# Patient Record
Sex: Male | Born: 1995 | Race: Black or African American | Hispanic: No | Marital: Single | Smoking: Never smoker
Health system: Southern US, Community
[De-identification: ages and names within clinical notes are randomized; demographics above are authoritative.]

## PROBLEM LIST (undated history)

## (undated) DIAGNOSIS — F4310 Post-traumatic stress disorder, unspecified: Secondary | ICD-10-CM

## (undated) HISTORY — DX: Post-traumatic stress disorder, unspecified: F43.10

---
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
## 2015-04-01 ENCOUNTER — Other Ambulatory Visit: Payer: Self-pay | Admitting: Orthopedic Surgery

## 2015-04-01 DIAGNOSIS — M25561 Pain in right knee: Secondary | ICD-10-CM

## 2015-04-06 ENCOUNTER — Ambulatory Visit
Admission: RE | Admit: 2015-04-06 | Discharge: 2015-04-06 | Disposition: A | Discharge status: Home / Self Care | Source: Ambulatory Visit | Attending: Orthopedic Surgery | Admitting: Orthopedic Surgery

## 2015-04-06 DIAGNOSIS — M25561 Pain in right knee: Secondary | ICD-10-CM

## 2015-04-21 ENCOUNTER — Ambulatory Visit (INDEPENDENT_AMBULATORY_CARE_PROVIDER_SITE_OTHER): Admitting: Physician Assistant

## 2015-04-21 VITALS — BP 120/72 | HR 80 | Temp 98.2°F | Resp 18 | Ht 72.0 in | Wt 190.0 lb

## 2015-04-21 DIAGNOSIS — Z113 Encounter for screening for infections with a predominantly sexual mode of transmission: Secondary | ICD-10-CM | POA: Diagnosis not present

## 2015-04-21 DIAGNOSIS — L293 Anogenital pruritus, unspecified: Secondary | ICD-10-CM

## 2015-04-21 LAB — POCT URINALYSIS DIP (MANUAL ENTRY)
BILIRUBIN UA: NEGATIVE
BILIRUBIN UA: NEGATIVE
Blood, UA: NEGATIVE
Glucose, UA: NEGATIVE
LEUKOCYTES UA: NEGATIVE
Nitrite, UA: NEGATIVE
SPEC GRAV UA: 1.025
Urobilinogen, UA: 0.2
pH, UA: 7

## 2015-04-21 NOTE — Patient Instructions (Signed)
I will call you with results of your lab tests. Wear condoms for every sexual encounter to protect yourself from STDs and prevent any unwanted pregnancy. Get STD testing BEFORE starting a new sexual relationship and make sure your partners get tested as well. Return with further problems/concerns.

## 2015-04-21 NOTE — Progress Notes (Signed)
[facility] [address] [place] [address] [phone]  Date:  04/21/2015   Name:  Evan Turner   DOB:  26-Apr-1996   MRN:  [id number]  PCP:  No primary care provider on file.    Chief Complaint: std check   History of Present Illness:  This is a 19 y.o. male who is presenting wanting STD testing. Had STD testing this summer and negative. Having a mild itch at the tip of his penis x 4 days. Stayed the same since then. Denies dysuria or penile discharge. States there is no rash or skin changes. Sexually active with women.  2 sexual partners in the past 6 months. Most recently sexually active with new partner 1 week ago and they did not use condoms. He has never had an STD before.  Review of Systems:  Review of Systems See HPI  There are no active problems to display for this patient.   Prior to Admission medications   Medication Sig Start Date End Date Taking? Authorizing Provider  aluminum chloride (DRYSOL) 20 % external solution Apply topically daily. 08/12/14  Yes Elvina Sidle, MD    No Known Allergies  History reviewed. No pertinent past surgical history.  Social History  Substance Use Topics  . Smoking status: Never Smoker   . Smokeless tobacco: None  . Alcohol Use: No    Family History  Problem Relation Age of Onset  . Hypertension Mother     Medication list has been reviewed and updated.  Physical Examination:  Physical Exam  Constitutional: He is oriented to person, place, and time. He appears well-developed and well-nourished. No distress.  HENT:  Head: Normocephalic and atraumatic.  Right Ear: Hearing normal.  Left Ear: Hearing normal.  Nose: Nose normal.  Eyes: Conjunctivae and lids are normal. Right eye exhibits no discharge. Left eye exhibits no discharge. No scleral icterus.  Pulmonary/Chest: Effort normal. No respiratory distress.  Genitourinary:  Pt declined exam  Musculoskeletal: Normal range of motion.   Neurological: He is alert and oriented to person, place, and time.  Skin: Skin is warm, dry and intact. No lesion and no rash noted.  Psychiatric: He has a normal mood and affect. His speech is normal and behavior is normal. Thought content normal.   BP 120/72 mmHg  Pulse 80  Temp(Src) 98.2 F (36.8 C) (Oral)  Resp 18  Ht 6' (1.829 m)  Wt 190 lb (86.183 kg)  BMI 25.76 kg/m2  SpO2 98%  Results for orders placed or performed in visit on 04/21/15  POCT urinalysis dipstick  Result Value Ref Range   Color, UA yellow yellow   Clarity, UA clear clear   Glucose, UA negative negative   Bilirubin, UA negative negative   Ketones, POC UA negative negative   Spec Grav, UA 1.025    Blood, UA negative negative   pH, UA 7.0    Protein Ur, POC trace (A) negative   Urobilinogen, UA 0.2    Nitrite, UA Negative Negative   Leukocytes, UA Negative Negative   Assessment and Plan:  1. Screen for STD (sexually transmitted disease) 2. Itching of penis UA normal. STD panel pending. If STDs negative and symptoms persist he will need to return for further evaluation and examination. We discussed safe sex practices and regular STD testing at length. - GC/Chlamydia Probe Amp - Hepatitis C antibody - Hepatitis B surface antigen - HIV antibody - RPR - POCT urinalysis dipstick   Roswell Miners. Danae Orleans,  PA-C, MHS [facility]  04/21/2015

## 2015-04-22 ENCOUNTER — Telehealth: Payer: Self-pay | Admitting: Physician Assistant

## 2015-04-22 DIAGNOSIS — A749 Chlamydial infection, unspecified: Secondary | ICD-10-CM

## 2015-04-22 LAB — HEPATITIS C ANTIBODY: HCV Ab: NEGATIVE

## 2015-04-22 LAB — HEPATITIS B SURFACE ANTIGEN: Hepatitis B Surface Ag: NEGATIVE

## 2015-04-22 LAB — HIV ANTIBODY (ROUTINE TESTING W REFLEX): HIV 1&2 Ab, 4th Generation: NONREACTIVE

## 2015-04-22 LAB — GC/CHLAMYDIA PROBE AMP
CT Probe RNA: POSITIVE — AB
GC Probe RNA: NEGATIVE

## 2015-04-22 LAB — RPR

## 2015-04-22 MED ORDER — AZITHROMYCIN 500 MG PO TABS: ORAL_TABLET | ORAL | Status: DC

## 2015-04-22 NOTE — Telephone Encounter (Signed)
Notified patient that he has chlamydia.  zithromax sent to pharmacy. Counseled on abstaining for sex for next 10 days and safe sex practices thereafter. He will notify partners. Please sent card to health dept.

## 2015-04-22 NOTE — Telephone Encounter (Signed)
Noted. [facility]

## 2015-07-28 ENCOUNTER — Ambulatory Visit (INDEPENDENT_AMBULATORY_CARE_PROVIDER_SITE_OTHER): Admitting: Family Medicine

## 2015-07-28 VITALS — BP 108/70 | HR 79 | Temp 98.3°F | Resp 16 | Ht 72.0 in | Wt 190.0 lb

## 2015-07-28 DIAGNOSIS — J069 Acute upper respiratory infection, unspecified: Secondary | ICD-10-CM

## 2015-07-28 NOTE — Assessment & Plan Note (Signed)
Viral URI.  No evidence of bacterial involvement or influenza.   - Sx management with Mucinex, Nasal Saline, cough medication, Nasonex if needed, ibuprofen.   - F/U PRN

## 2015-07-28 NOTE — Progress Notes (Signed)
Evan Turner is a 20 y.o. male who presents today for possible flu.  URI - Ongoing now since Sunday night (4 days), improving at this time.  Does endorse cough and sensation of having fever.  Denies occasional body aches but no chills.  Did have a few episodes of sweats but these have dissipated.  Nyquil helped quite a bit.  No recent sick contacts/travel.   History reviewed. No pertinent past medical history.  History  Smoking status  . Never Smoker   Smokeless tobacco  . Not on file    Family History  Problem Relation Age of Onset  . Hypertension Mother     Current Outpatient Prescriptions on File Prior to Visit  Medication Sig Dispense Refill  . aluminum chloride (DRYSOL) 20 % external solution Apply topically daily. (Patient not taking: Reported on 07/28/2015) 60 mL 11  . azithromycin (ZITHROMAX) 500 MG tablet Take 2 tabs (1 gm) po once. (Patient not taking: Reported on 07/28/2015) 2 tablet 0   No current facility-administered medications on file prior to visit.    ROS: Per HPI.  All other systems reviewed and are negative.   Physical Exam Filed Vitals:   07/28/15 1826  BP: 108/70  Pulse: 79  Temp: 98.3 F (36.8 C)  Resp: 16    Physical Examination: General appearance - alert, well appearing, and in no distress Mouth - mucous membranes moist, pharynx normal without lesions Lymphatics - no palpable lymphadenopathy Chest - clear to auscultation, no wheezes, rales or rhonchi, symmetric air entry Heart - normal rate and regular rhythm

## 2015-08-02 ENCOUNTER — Ambulatory Visit (INDEPENDENT_AMBULATORY_CARE_PROVIDER_SITE_OTHER): Admitting: Family Medicine

## 2015-08-02 VITALS — BP 102/68 | HR 70 | Temp 98.3°F | Resp 16 | Ht 73.0 in | Wt 190.0 lb

## 2015-08-02 DIAGNOSIS — Z1383 Encounter for screening for respiratory disorder NEC: Secondary | ICD-10-CM | POA: Diagnosis not present

## 2015-08-02 DIAGNOSIS — L209 Atopic dermatitis, unspecified: Secondary | ICD-10-CM | POA: Diagnosis not present

## 2015-08-02 DIAGNOSIS — Z113 Encounter for screening for infections with a predominantly sexual mode of transmission: Secondary | ICD-10-CM

## 2015-08-02 DIAGNOSIS — Z1389 Encounter for screening for other disorder: Secondary | ICD-10-CM

## 2015-08-02 DIAGNOSIS — Z1329 Encounter for screening for other suspected endocrine disorder: Secondary | ICD-10-CM | POA: Diagnosis not present

## 2015-08-02 DIAGNOSIS — Z136 Encounter for screening for cardiovascular disorders: Secondary | ICD-10-CM | POA: Diagnosis not present

## 2015-08-02 DIAGNOSIS — B356 Tinea cruris: Secondary | ICD-10-CM

## 2015-08-02 DIAGNOSIS — R21 Rash and other nonspecific skin eruption: Secondary | ICD-10-CM

## 2015-08-02 LAB — POC MICROSCOPIC URINALYSIS (UMFC): MUCUS RE: ABSENT

## 2015-08-02 LAB — COMPREHENSIVE METABOLIC PANEL
ALT: 12 U/L (ref 8–46)
AST: 15 U/L (ref 12–32)
Albumin: 4.7 g/dL (ref 3.6–5.1)
Alkaline Phosphatase: 51 U/L (ref 48–230)
BUN: 13 mg/dL (ref 7–20)
CALCIUM: 9.6 mg/dL (ref 8.9–10.4)
CO2: 25 mmol/L (ref 20–31)
Chloride: 100 mmol/L (ref 98–110)
Creat: 1.13 mg/dL (ref 0.60–1.26)
GLUCOSE: 85 mg/dL (ref 65–99)
POTASSIUM: 4.2 mmol/L (ref 3.8–5.1)
Sodium: 138 mmol/L (ref 135–146)
Total Bilirubin: 0.5 mg/dL (ref 0.2–1.1)
Total Protein: 7.7 g/dL (ref 6.3–8.2)

## 2015-08-02 LAB — POCT CBC
GRANULOCYTE PERCENT: 48.8 % (ref 37–80)
HEMATOCRIT: 45.4 % (ref 43.5–53.7)
Hemoglobin: 15.3 g/dL (ref 14.1–18.1)
Lymph, poc: 1.5 (ref 0.6–3.4)
MCH, POC: 26.8 pg — AB (ref 27–31.2)
MCHC: 33.8 g/dL (ref 31.8–35.4)
MCV: 79.5 fL — AB (ref 80–97)
MID (cbc): 0.3 (ref 0–0.9)
MPV: 7.3 fL (ref 0–99.8)
POC GRANULOCYTE: 1.7 — AB (ref 2–6.9)
POC LYMPH %: 43.7 % (ref 10–50)
POC MID %: 7.5 % (ref 0–12)
Platelet Count, POC: 212 10*3/uL (ref 142–424)
RBC: 5.71 M/uL (ref 4.69–6.13)
RDW, POC: 12.7 %
WBC: 3.5 10*3/uL — AB (ref 4.6–10.2)

## 2015-08-02 LAB — TSH: TSH: 3.095 u[IU]/mL (ref 0.350–4.500)

## 2015-08-02 LAB — POCT URINALYSIS DIP (MANUAL ENTRY)
Bilirubin, UA: NEGATIVE
Blood, UA: NEGATIVE
GLUCOSE UA: NEGATIVE
LEUKOCYTES UA: NEGATIVE
Nitrite, UA: NEGATIVE
PROTEIN UA: NEGATIVE
SPEC GRAV UA: 1.025
UROBILINOGEN UA: 0.2
pH, UA: 6

## 2015-08-02 LAB — HIV ANTIBODY (ROUTINE TESTING W REFLEX): HIV 1&2 Ab, 4th Generation: NONREACTIVE

## 2015-08-02 LAB — POCT SEDIMENTATION RATE: POCT SED RATE: 10 mm/hr (ref 0–22)

## 2015-08-02 MED ORDER — CLOTRIMAZOLE 1 % EX CREA: 1.0000 "application " | TOPICAL_CREAM | Freq: Two times a day (BID) | CUTANEOUS | Status: DC

## 2015-08-02 MED ORDER — HYDROCORTISONE VALERATE 0.2 % EX OINT: 1.0000 "application " | TOPICAL_OINTMENT | Freq: Two times a day (BID) | CUTANEOUS | Status: DC

## 2015-08-02 NOTE — Progress Notes (Signed)
Subjective:    Patient ID: Rollan Roger, male    DOB: May 09, 1996, 20 y.o.   MRN: [id number] By signing my name below, I, Javier Docker, attest that this documentation has been prepared under the direction and in the presence of Norberto Sorenson, MD. Electronically Signed: Javier Docker, ER Scribe. 08/02/2015. 4:45 PM.  Chief Complaint  Patient presents with  . Rash    both, itch    HPI HPI Comments: Reiner Loewen is a 20 y.o. male who presents to [facility] complaining of skin itching. He states that throughout his life he has noticed that this skin itches at bed time. He has used the same body wash his entire life. He states that he wants to be tested for everything, because he read that kidney disease is associated with skin rashes on on Facebook. He did have a groin rash treated at an Urgent Care several weeks, that was treated with nystatin cream. He felt that the nystatin was changing the texture and color of his skin so he discontinued using the cream before the rash was completely resolved. He has not hx of eczema or atopy, has several patches of light and dark skin which have been there throughout his life.   History reviewed. No pertinent past medical history.  No Known Allergies  No current outpatient prescriptions on file prior to visit.   No current facility-administered medications on file prior to visit.   Review of Systems  Constitutional: Negative for fever, chills, activity change and fatigue.  Genitourinary: Negative for dysuria, urgency, frequency, hematuria, decreased urine volume, discharge, penile swelling, scrotal swelling, enuresis, difficulty urinating, genital sores, penile pain and testicular pain.  Skin: Positive for color change and rash.  Hematological: Does not bruise/bleed easily.      Objective:  BP 102/68 mmHg  Pulse 70  Temp(Src) 98.3 F (36.8 C)  Resp 16  Ht  (1.854 m)  Wt 190 lb (86.183 kg)  BMI 25.07 kg/m2  SpO2 99%  Physical Exam    Constitutional: He is oriented to person, place, and time. He appears well-developed and well-nourished. No distress.  HENT:  Head: Normocephalic and atraumatic.  Eyes: Pupils are equal, round, and reactive to light.  Neck: Neck supple.  Cardiovascular: Normal rate.   Pulmonary/Chest: Effort normal. No respiratory distress.  Genitourinary:  Greyish hued serpiginous scaly dry rash on the distal 1-2 inches of penile head. None around base or perineum.   Musculoskeletal: Normal range of motion.  Neurological: He is alert and oriented to person, place, and time. Coordination normal.  Skin: Skin is warm and dry. He is not diaphoretic.  Psychiatric: He has a normal mood and affect. His behavior is normal.  Nursing note and vitals reviewed. \   Rash did not fluoresce under woods lamp.  Assessment & Plan:   1. Rash and nonspecific skin eruption   2. Tinea cruris   3. Screening for STD (sexually transmitted disease)   4. Screening for cardiovascular, respiratory, and genitourinary diseases   5. Screening for thyroid disorder   6. Atopic dermatitis, mild     Orders Placed This Encounter  Procedures  . GC/Chlamydia Probe Amp  . Trichomonas vaginalis, RNA  . Comprehensive metabolic panel  . TSH  . RPR  . HIV antibody  . HSV(herpes simplex vrs) 1+2 ab-IgG  . Hepatitis C Antibody  . POCT CBC  . POCT SEDIMENTATION RATE  . POCT urinalysis dipstick  . POCT Microscopic Urinalysis ([facility])    Meds  ordered this encounter  Medications  . clotrimazole (LOTRIMIN) 1 % cream    Sig: Apply 1 application topically 2 (two) times daily.    Dispense:  113 g    Refill:  0  . hydrocortisone valerate ointment (WESTCORT) 0.2 %    Sig: Apply 1 application topically 2 (two) times daily.    Dispense:  120 g    Refill:  3    Please mix in a 1:1 ratio with Eucerin    I personally performed the services described in this documentation, which was scribed in my presence. The recorded information has  been reviewed and considered, and addended by me as needed.  Norberto Sorenson, MD MPH

## 2015-08-02 NOTE — Patient Instructions (Signed)
General skin care measures aimed at reducing skin irritation and restoring the skin barrier include:  Using lukewarm water and soap-free cleansers to wash as appropriate Dry thoroughly after washing  Applying emollients (eg, petroleum jelly) immediately after drying and as often as possible  Wearing cotton  Removing rings and watches and bracelets before wet work  Wearing protective clothing in cold weather  Avoiding exposure to irritants (eg, detergents, solvents, hair lotions or dyes, acidic foods [eg, citrus fruit])   Apply the Westcort hydrocortisone cream to your body until those rashes are completely gone and then switch over to good hypoallergenic thick moisturizer cream such as Eucerin, Cedaphil, or Aquaphor and apply this continually twice a day - especially immediately after showering to prevent it from coming back.  Consider trying a dandruff shampoo such as generic Selson Blue as a body wash for a week - look for anything with the active ingredient of selenium sulfide 5%.  Jock Itch Jock itch (tinea cruris) is a fungal infection of the skin in the groin area. It is sometimes called ringworm, even though it is not caused by worms. It is caused by a fungus, which is a type of germ that thrives in dark, damp places. Jock itch causes a rash and itching in the groin and upper thigh area. It usually goes away in 2-3 weeks with treatment. CAUSES The fungus that causes jock itch may be spread by:  Touching a fungus infection elsewhere on your body--such as athlete's foot--and then touching your groin area.  Sharing towels or clothing with an infected person. RISK FACTORS Jock itch is most common in men and adolescent boys. This condition is more likely to develop from:  Being in hot, humid climates.  Wearing tight-fitting clothing or wet bathing suits for long periods of time.  Participating in sports.  Being overweight.  Having diabetes. SYMPTOMS Symptoms of jock itch may  include:  A red, pink, or brown rash in the groin area. The rash may spread to the thighs, anus, and buttocks.  Dry and scaly skin on or around the rash.  Itchiness. DIAGNOSIS Most often, a health care provider can make the diagnosis by looking at your rash. Sometimes, a scraping of the infected skin will be taken. This sample may be tested by looking at it under a microscope or by trying to grow the fungus from the sample (culture).  TREATMENT Treatment for this condition may include:  Antifungal medicine to kill the fungus. This may be in various forms:  Skin cream or ointment.  Medicine taken by mouth.  Skin cream or ointment to reduce the itching.  Compresses or medicated powders to dry the infected skin. HOME CARE INSTRUCTIONS  Take medicines only as directed by your health care provider. Apply skin creams or ointments exactly as directed.  Wear loose-fitting clothing.  Men should wear cotton boxer shorts.  Women should wear cotton underwear.  Change your underwear every day to keep your groin dry.  Avoid hot baths.  Dry your groin area well after bathing.  Use a separate towel to dry your groin area. This will help to prevent a spreading of the infection to other areas of your body.  Do not scratch the affected area.  Do not share towels with other people. SEEK MEDICAL CARE IF:  Your rash does not improve or it gets worse after 2 weeks of treatment.  Your rash is spreading.  Your rash returns after treatment is finished.  You have a fever.  You have redness, swelling, or pain in the area around your rash.  You have fluid, blood, or pus coming from your rash.  Your have your rash for more than 4 weeks.   This information is not intended to replace advice given to you by your health care provider. Make sure you discuss any questions you have with your health care provider.   Document Released: 06/16/2002 Document Revised: 07/17/2014 Document Reviewed:  04/07/2014 Elsevier Interactive Patient Education 2016 [employer] Inc[employer]  Eczema Eczema, also called atopic dermatitis, is a skin disorder that causes inflammation of the skin. It causes a red rash and dry, scaly skin. The skin becomes very itchy. Eczema is generally worse during the cooler winter months and often improves with the warmth of summer. Eczema usually starts showing signs in infancy. Some children outgrow eczema, but it may last through adulthood.  CAUSES  The exact cause of eczema is not known, but it appears to run in families. People with eczema often have a family history of eczema, allergies, asthma, or hay fever. Eczema is not contagious. Flare-ups of the condition may be caused by:   Contact with something you are sensitive or allergic to.   Stress. SIGNS AND SYMPTOMS  Dry, scaly skin.   Red, itchy rash.   Itchiness. This may occur before the skin rash and may be very intense.  DIAGNOSIS  The diagnosis of eczema is usually made based on symptoms and medical history. TREATMENT  Eczema cannot be cured, but symptoms usually can be controlled with treatment and other strategies. A treatment plan might include:  Controlling the itching and scratching.   Use over-the-counter antihistamines as directed for itching. This is especially useful at night when the itching tends to be worse.   Use over-the-counter steroid creams as directed for itching.   Avoid scratching. Scratching makes the rash and itching worse. It may also result in a skin infection (impetigo) due to a break in the skin caused by scratching.   Keeping the skin well moisturized with creams every day. This will seal in moisture and help prevent dryness. Lotions that contain alcohol and water should be avoided because they can dry the skin.   Limiting exposure to things that you are sensitive or allergic to (allergens).   Recognizing situations that cause stress.   Developing a plan to manage  stress.  HOME CARE INSTRUCTIONS   Only take over-the-counter or prescription medicines as directed by your health care provider.   Do not use anything on the skin without checking with your health care provider.   Keep baths or showers short (5 minutes) in warm (not hot) water. Use mild cleansers for bathing. These should be unscented. You may add nonperfumed bath oil to the bath water. It is best to avoid soap and bubble bath.   Immediately after a bath or shower, when the skin is still damp, apply a moisturizing ointment to the entire body. This ointment should be a petroleum ointment. This will seal in moisture and help prevent dryness. The thicker the ointment, the better. These should be unscented.   Keep fingernails cut short. Children with eczema may need to wear soft gloves or mittens at night after applying an ointment.   Dress in clothes made of cotton or cotton blends. Dress lightly, because heat increases itching.   A child with eczema should stay away from anyone with fever blisters or cold sores. The virus that causes fever blisters (herpes simplex) can cause a serious skin  infection in children with eczema. SEEK MEDICAL CARE IF:   Your itching interferes with sleep.   Your rash gets worse or is not better within 1 week after starting treatment.   You see pus or soft yellow scabs in the rash area.   You have a fever.   You have a rash flare-up after contact with someone who has fever blisters.    This information is not intended to replace advice given to you by your health care provider. Make sure you discuss any questions you have with your health care provider.   Document Released: 06/23/2000 Document Revised: 04/16/2013 Document Reviewed: 01/27/2013 Elsevier Interactive Patient Education [employer]

## 2015-08-03 LAB — TRICHOMONAS VAGINALIS, PROBE AMP: TRICHOMONAS VAGINALIS PROBE APTIMA: NEGATIVE

## 2015-08-03 LAB — GC/CHLAMYDIA PROBE AMP
CT PROBE, AMP APTIMA: NOT DETECTED
GC PROBE AMP APTIMA: NOT DETECTED

## 2015-08-03 LAB — HSV(HERPES SIMPLEX VRS) I + II AB-IGG: HSV 2 GLYCOPROTEIN G AB, IGG: 0.43 IV

## 2015-08-03 LAB — RPR

## 2015-08-03 LAB — HEPATITIS C ANTIBODY: HCV Ab: NEGATIVE

## 2015-08-06 ENCOUNTER — Encounter: Payer: Self-pay | Admitting: Family Medicine

## 2015-10-05 ENCOUNTER — Ambulatory Visit (INDEPENDENT_AMBULATORY_CARE_PROVIDER_SITE_OTHER): Admitting: Physician Assistant

## 2015-10-05 VITALS — BP 112/76 | HR 76 | Temp 98.3°F | Resp 16 | Ht 72.0 in | Wt 193.0 lb

## 2015-10-05 DIAGNOSIS — R21 Rash and other nonspecific skin eruption: Secondary | ICD-10-CM

## 2015-10-05 DIAGNOSIS — L209 Atopic dermatitis, unspecified: Secondary | ICD-10-CM | POA: Diagnosis not present

## 2015-10-05 MED ORDER — PERMETHRIN 5 % EX CREA: 1.0000 "application " | TOPICAL_CREAM | Freq: Once | CUTANEOUS | Status: DC

## 2015-10-05 NOTE — Progress Notes (Addendum)
[facility] [address], [place] [address] [phone]  Date:  10/05/2015   Name:  Evan Turner   DOB:  02-14-1996   MRN:  [id number]  PCP:  No PCP Per Patient    History of Present Illness:  Evan Turner is a 20 y.o. male patient who [facility] cc of pruritic rash.    Itching on legs and arms.    At bedtime, his legs are itching.  He has bumps at his inner fingers.  Stomach area has pruritus as well.   Clear fluid came out of the finger.  There is no pain.   He works out 2-3 times per week.   Bathe once to twice per day. Seasonal allergies. He was seen at our office 3 months ago, for what was dxd as tinea cruris and atopic dermatitis.  Given steroid cream and antifungal.  He reports that he never used it.  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   No outdoor play.  No animal exposure.  No sob or dyspnea.      Patient Active Problem List   Diagnosis Date Noted  . Acute upper respiratory infection 07/28/2015    History reviewed. No pertinent past medical history.  History reviewed. No pertinent past surgical history.  Social History  Substance Use Topics  . Smoking status: Never Smoker   . Smokeless tobacco: None  . Alcohol Use: No    Family History  Problem Relation Age of Onset  . Hypertension Mother     No Known Allergies  Medication list has been reviewed and updated.  Current Outpatient Prescriptions on File Prior to Visit  Medication Sig Dispense Refill  . clotrimazole (LOTRIMIN) 1 % cream Apply 1 application topically 2 (two) times daily. (Patient not taking: Reported on 10/05/2015) 113 g 0  . hydrocortisone valerate ointment (WESTCORT) 0.2 % Apply 1 application topically 2 (two) times daily. (Patient not taking: Reported on 10/05/2015) 120 g 3   No current facility-administered medications on file prior to  visit.    ROS ROS otherwise unremarkable unless listed above.   Physical Examination: BP 112/76 mmHg  Pulse 76  Temp(Src) 98.3 F (36.8 C)  Resp 16  Ht 6' (1.829 m)  Wt 193 lb (87.544 kg)  BMI 26.17 kg/m2  SpO2 98% Ideal Body Weight: Weight in (lb) to have BMI = 25: 183.9  Physical Exam  Constitutional: He is oriented to person, place, and time. He appears well-developed and well-nourished. No distress.  HENT:  Head: Normocephalic and atraumatic.  Eyes: Conjunctivae and EOM are normal. Pupils are equal, round, and reactive to light.  Cardiovascular: Normal rate.   Pulmonary/Chest: Effort normal. No respiratory distress.  Neurological: He is alert and oriented to person, place, and time.  Skin: Skin is warm and dry. He is not diaphoretic.  Scant scaling papules along the groin area. He has papular lesions scant on his lower extremity.  Inner pip fingers are tiny papular cluster without erythema or exudate.  There are no skin eruptions within the interwebbing or flexor areas of wrist lower extremity, and groin line.  Psychiatric: He has a normal mood and affect. His behavior is normal.     Assessment and Plan: Evan Turner is a 20 y.o. male who is here today for cc of rash. I have advised him that  he needs to pick up the treatment that he was prior seen.  Due to his extreme concern of scabies, I will initiate one treatment of the permethrin, as he is young healthy, and this should not cause a problem.  But I have been persistent in him following proper treatment plan both verbally and in handout.   Rash and nonspecific skin eruption - Plan: permethrin (ELIMITE) 5 % cream  Atopic dermatitis    Trena Platt, PA-C [facility] 10/05/2015 5:08 PM

## 2015-10-05 NOTE — Patient Instructions (Addendum)
IF you received an x-ray today, you will receive an invoice from [facility]. Please contact [facility] at [phone] with questions or concerns regarding your invoice.   IF you received labwork today, you will receive an invoice from [employer]. Please contact [facility] at [phone] with questions or concerns regarding your invoice.   Our billing staff will not be able to assist you with questions regarding bills from these companies.  You will be contacted with the lab results as soon as they are available. The fastest way to get your results is to activate your My Chart account. Instructions are located on the last page of this paperwork. If you have not heard from [place] regarding the results in 2 weeks, please contact this office.    I would like you to do the permethrin cream.  Place from head to toe.  I would like you wait 8-14 hours, before showering this off.   I ALSO need you to pick up the prescriptions, that Dr. Clelia Croft prescribed and do for the next 2 weeks.  If you continue to have the symptoms, you need to return.Scabies, Adult Scabies is a skin condition that happens when very small insects get under the skin (infestation). This causes a rash and severe itchiness. Scabies can spread from person to person (is contagious). If you get scabies, it is common for others in your household to get scabies too. With proper treatment, symptoms usually go away in 2-4 weeks. Scabies usually does not cause lasting problems. CAUSES This condition is caused by mites (Sarcoptes scabiei, or human itch mites) that can only be seen with a microscope. The mites get into the top layer of skin and lay eggs. Scabies can spread from person to person through:  Close contact with a person who has scabies.  Contact with infested items, such as towels, bedding, or clothing. RISK FACTORS This condition is more likely to develop in:  People who live in  nursing homes and other extended-care facilities.  People who have sexual contact with a partner who has scabies.  Young children who attend child care facilities.  People who care for others who are at increased risk for scabies. SYMPTOMS Symptoms of this condition may include:  Severe itchiness. This is often worse at night.  A rash that includes tiny red bumps or blisters. The rash commonly occurs on the wrist, elbow, armpit, fingers, waist, groin, or buttocks. Bumps may form a line (burrow) in some areas.  Skin irritation. This can include scaly patches or sores. DIAGNOSIS This condition is diagnosed with a physical exam. Your health care provider will look closely at your skin. In some cases, your health care provider may take a sample of your affected skin (skin scraping) and have it examined under a microscope. TREATMENT This condition may be treated with:  Medicated cream or lotion that kills the mites. This is spread on the entire body and left on for several hours. Usually, one treatment with medicated cream or lotion is enough to kill all of the mites. In severe cases, the treatment may be repeated.  Medicated cream that relieves itching.  Medicines that help to relieve itching.  Medicines that kill the mites. This treatment is rarely used. HOME CARE INSTRUCTIONS Medicines  Take or apply over-the-counter and prescription medicines as told by your health care provider.  Apply medicated cream or lotion as told by your health care provider.  Do not wash off the medicated cream or  lotion until the necessary amount of time has passed. Skin Care  Avoid scratching your affected skin.  Keep your fingernails closely trimmed to reduce injury from scratching.  Take cool baths or apply cool washcloths to help reduce itching. General Instructions  Clean all items that you recently had contact with, including bedding, clothing, and furniture. Do this on the same day that your  treatment starts.  Use hot water when you wash items.  Place unwashable items into closed, airtight plastic bags for at least 3 days. The mites cannot live for more than 3 days away from human skin.  Vacuum furniture and mattresses that you use.  Make sure that other people who may have been infested are examined by a health care provider. These include members of your household and anyone who may have had contact with infested items.  Keep all follow-up visits as told by your health care provider. This is important. SEEK MEDICAL CARE IF:  You have itching that does not go away after 4 weeks of treatment.  You continue to develop new bumps or burrows.  You have redness, swelling, or pain in your rash area after treatment.  You have fluid, blood, or pus coming from your rash.   This information is not intended to replace advice given to you by your health care provider. Make sure you discuss any questions you have with your health care provider.   Document Released: 03/17/2015 Document Reviewed: 01/26/2015 Elsevier Interactive Patient Education [employer]2016 Elsevier Inc.

## 2017-01-25 IMAGING — MR MR KNEE*R* W/O CM
4 of 6 series · 21 of 40 positions shown · non-contrast
Comparison: None.

CLINICAL DATA: Six week history of knee pain.  Twisting injury.

EXAM:
MRI OF THE RIGHT KNEE WITHOUT CONTRAST
TECHNIQUE: Multiplanar, multisequence MR imaging of the knee was performed. No
intravenous contrast was administered.

[Series 3: pd_tse_fs_tra · axial · 3.5mm · 0.42mm/px · z∈[-48,+32]mm · 3 of 24 slices shown]
[im 5/24]
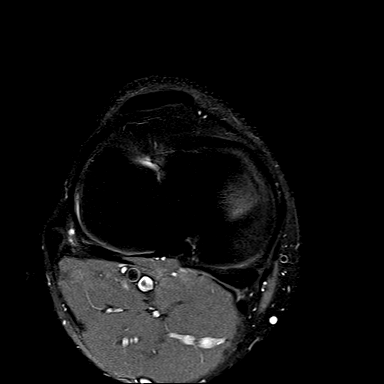
[im 14/24]
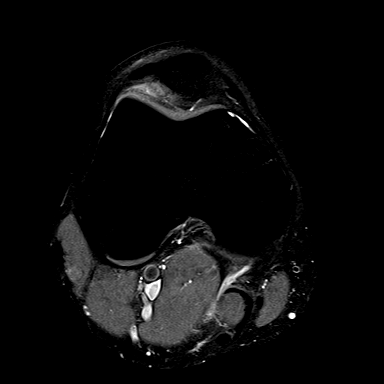
[im 24/24]
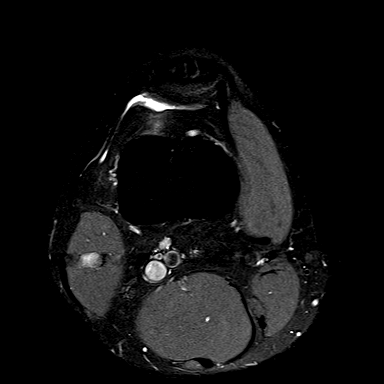

[Series 5: T2 fat-sat · coronal · 3.2mm · 0.62mm/px · 8 of 26 slices shown]
[im 1/26]
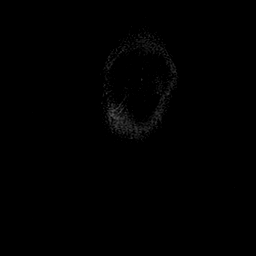
[im 4/26]
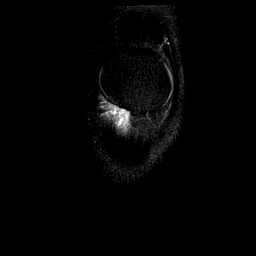
[im 8/26]
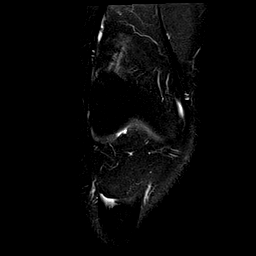
[im 11/26]
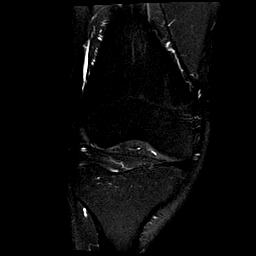
[im 15/26]
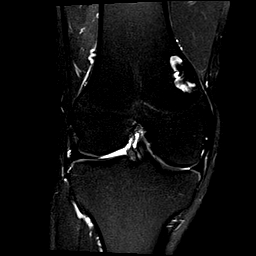
[im 18/26]
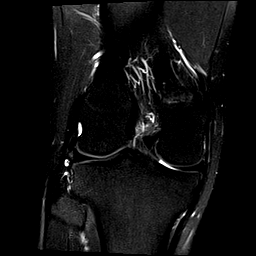
[im 22/26]
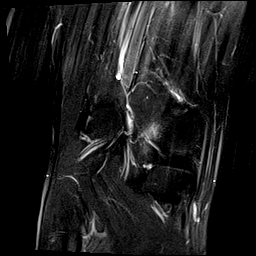
[im 26/26]
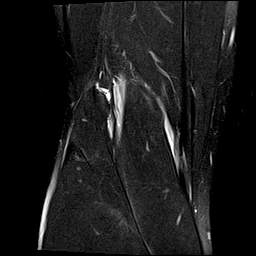

[Series 6: PD fat-sat · sagittal · 3.5mm · 0.25mm/px · 7 of 23 slices shown]
[im 1/23]
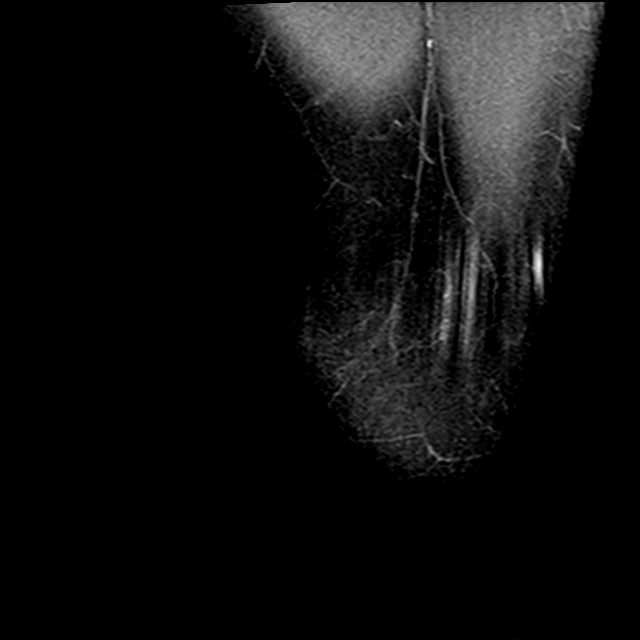
[im 4/23]
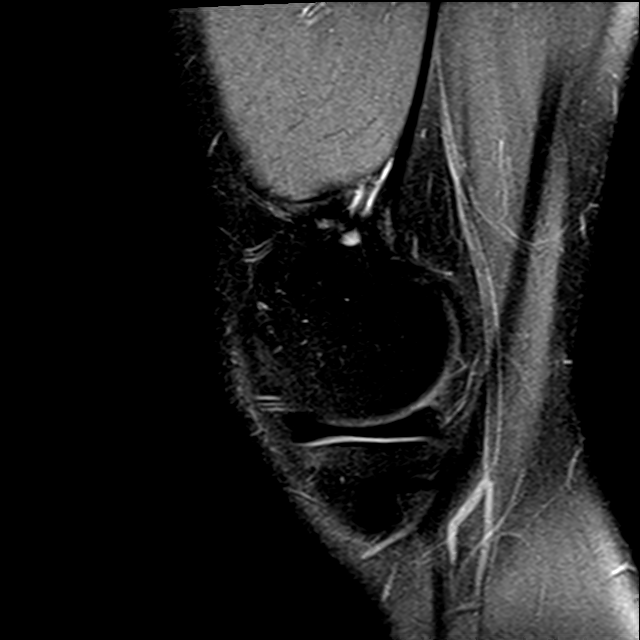
[im 8/23]
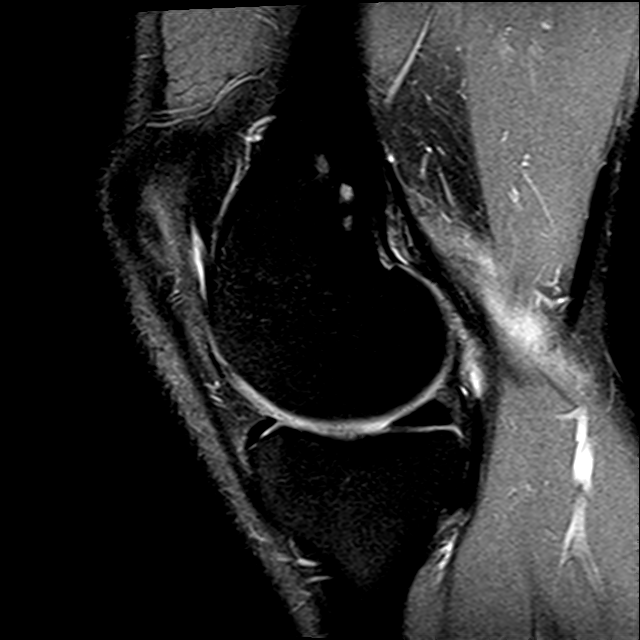
[im 12/23]
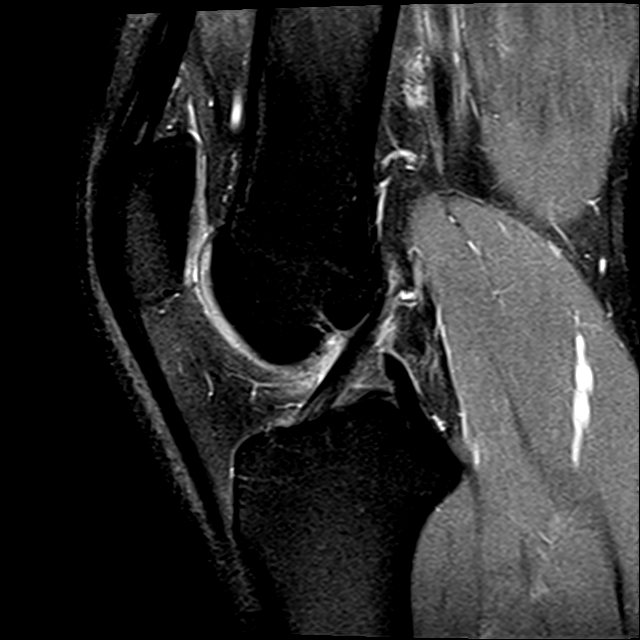
[im 15/23]
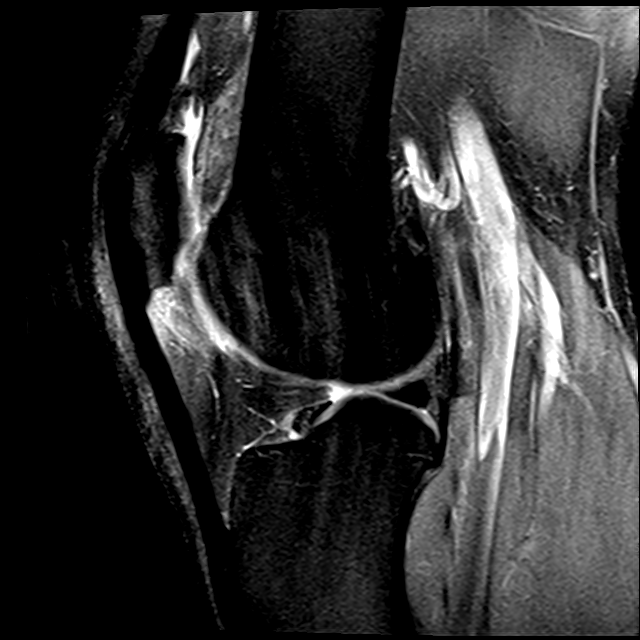
[im 19/23]
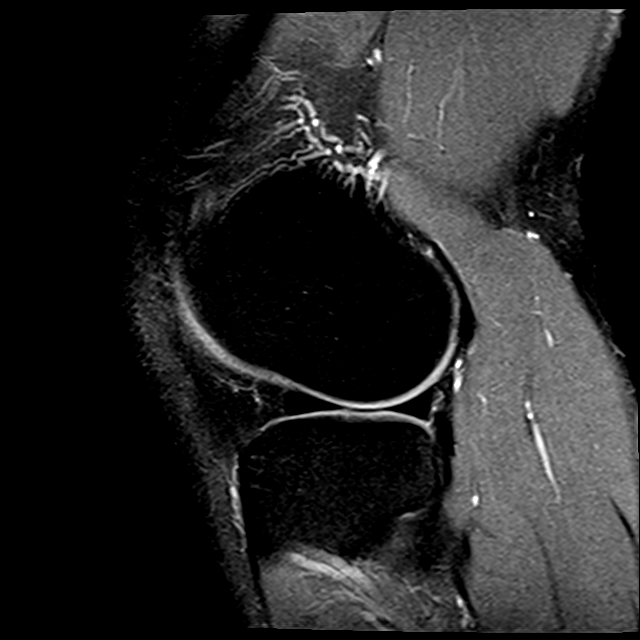
[im 23/23]
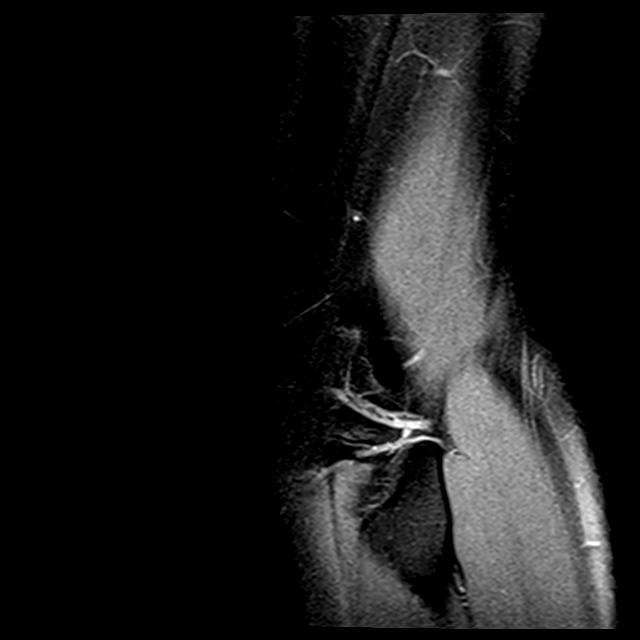

[Series 7: T1 · coronal · 3.2mm · 0.25mm/px · 3 of 26 slices shown]
[im 4/26]
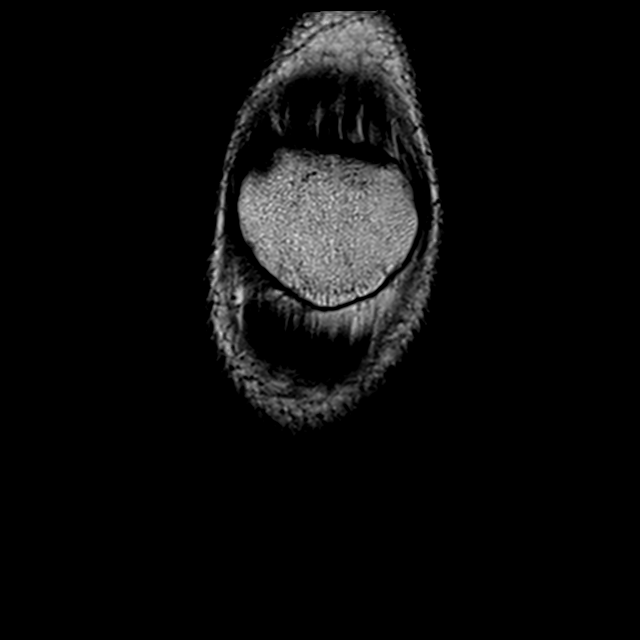
[im 15/26]
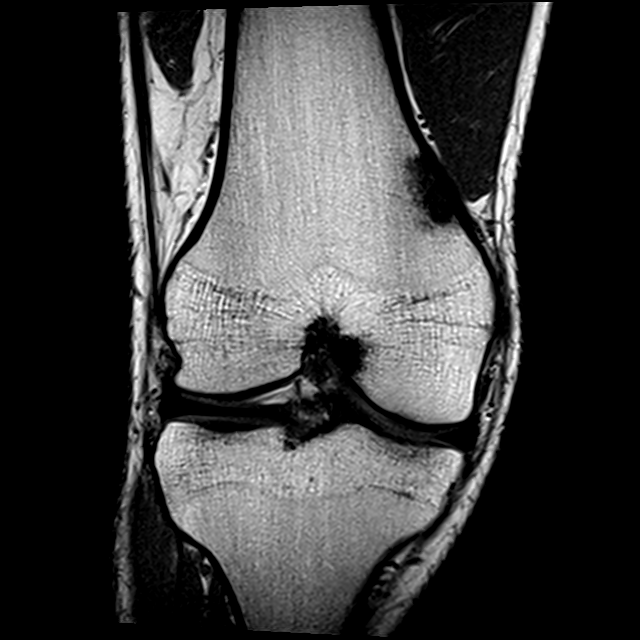
[im 22/26]
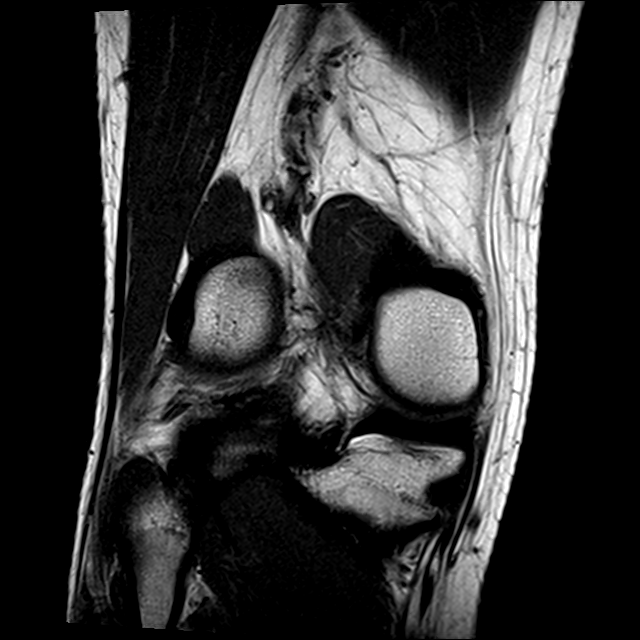

[21 of 40 positions shown; findings below may reference images not displayed]

FINDINGS: MENISCI

Medial meniscus:  Intact.

Lateral meniscus:  Intact.

LIGAMENTS

Cruciates:  Intact.

Collaterals:  Intact.

CARTILAGE

Patellofemoral:  Normal

Medial:  Normal

Lateral:  Normal

Joint:  No joint effusion or synovitis.

Popliteal Fossa:  Very small Baker's cyst.

Extensor Mechanism: The patella retinacular structures are intact
and the quadriceps and patellar tendons are intact. There is mild
edema like signal abnormality in in the upper lateral aspect of
Hoffa's fat between the lateral femoral condyle and the lateral
aspect of the upper patellar tendon. This is usually seen with an
overuse syndrome or tracking problem.

Bones: Benign fibrous cortical lesion involving the metadiaphyseal
region of the medial femur. No bone contusion, marrow edema or
osteochondral abnormality.
IMPRESSION: 1. Mild inflammation in the upper lateral aspect of Hoffa's fat
between the lateral femoral condyle and the upper aspect of the
lateral patellar tendon. This is typically seen with an overuse
syndrome or patellar tracking abnormality.
2. Benign fibrous cortical lesion involving the medial femur.
3. Intact ligamentous structures and no acute bony findings.
4. No meniscal tears and intact articular cartilage.
5. No joint effusion.  Very small Baker's cyst.

## 2017-06-26 ENCOUNTER — Encounter: Payer: Self-pay | Admitting: Urgent Care

## 2017-06-26 ENCOUNTER — Ambulatory Visit (INDEPENDENT_AMBULATORY_CARE_PROVIDER_SITE_OTHER): Admitting: Urgent Care

## 2017-06-26 ENCOUNTER — Ambulatory Visit: Admitting: Urgent Care

## 2017-06-26 VITALS — BP 122/80 | HR 84 | Temp 97.9°F | Resp 16 | Ht 72.0 in | Wt 194.0 lb

## 2017-06-26 DIAGNOSIS — R059 Cough, unspecified: Secondary | ICD-10-CM

## 2017-06-26 DIAGNOSIS — Z113 Encounter for screening for infections with a predominantly sexual mode of transmission: Secondary | ICD-10-CM | POA: Diagnosis not present

## 2017-06-26 DIAGNOSIS — R6889 Other general symptoms and signs: Secondary | ICD-10-CM | POA: Diagnosis not present

## 2017-06-26 DIAGNOSIS — R05 Cough: Secondary | ICD-10-CM

## 2017-06-26 MED ORDER — PSEUDOEPHEDRINE HCL ER 120 MG PO TB12: 120.0000 mg | ORAL_TABLET | Freq: Two times a day (BID) | ORAL | 3 refills | Status: DC

## 2017-06-26 MED ORDER — CETIRIZINE HCL 10 MG PO TABS: 10.0000 mg | ORAL_TABLET | Freq: Every day | ORAL | 11 refills | Status: DC

## 2017-06-26 NOTE — Progress Notes (Signed)
  MRN: [id number] DOB: March 03, 1996  Subjective:   Evan Turner is a 21 y.o. male presenting for several day history of throat congestion, mucus build up. Also has mild cough, has to clear his throat. Has not tried any medications for relief. Does not hydrate well. Denies fever, sore throat, ear pain, sinus pain, chest pain, shob, wheezing, n/v, abdominal pain. Denies smoking cigarettes. Has unprotected sex and would like to have STI testing.   Evan Turner is not currently taking any medications and has No Known Allergies.  Evan Turner denies past medical and surgical history.   Objective:   Vitals: BP 122/80   Pulse 84   Temp 97.9 F (36.6 C) (Oral)   Resp 16   Ht 6' (1.829 m)   Wt 194 lb (88 kg)   SpO2 98%   BMI 26.31 kg/m   Physical Exam  Constitutional: He is oriented to person, place, and time. He appears well-developed and well-nourished.  HENT:  TM's intact bilaterally, no effusions or erythema. Nasal turbinates boggy, nasal passages patent. No sinus tenderness. Throat with significant post-nasal drianage, mucous membranes moist.   Eyes: Right eye exhibits no discharge. Left eye exhibits no discharge.  Neck: Normal range of motion. Neck supple.  Cardiovascular: Normal rate, regular rhythm and intact distal pulses. Exam reveals no gallop and no friction rub.  No murmur heard. Pulmonary/Chest: No respiratory distress. He has no wheezes. He has no rales.  Lymphadenopathy:    He has no cervical adenopathy.  Neurological: He is alert and oriented to person, place, and time.  Skin: Skin is warm and dry.  Psychiatric: He has a normal mood and affect.   Assessment and Plan :   1. Throat congestion 2. Cough - Will have patient hydrate better, use Zyrtec and Sudafed for congestion. Return-to-clinic precautions discussed, patient verbalized understanding.   3. Routine screening for STI (sexually transmitted infection) - HIV antibody - RPR - Trichomonas vaginalis, RNA - GC/Chlamydia Probe  Amp   Wallis BambergMario Kanyla Omeara, PA-C [facility] [phone] 06/26/2017  2:16 PM

## 2017-06-26 NOTE — Patient Instructions (Addendum)
Make sure you hydrate very well with 1 gallon of water (2 liters) daily.   Safe Sex Practicing safe sex means taking steps before and during sex to reduce your risk of:  Getting an STD (sexually transmitted disease).  Giving your partner an STD.  Unwanted pregnancy.  How can I practice safe sex?  To practice safe sex:  Limit your sexual partners to only one partner who is having sex with only you.  Avoid using alcohol and recreational drugs before having sex. These substances can affect your judgment.  Before having sex with a new partner: ? Talk to your partner about past partners, past STDs, and drug use. ? You and your partner should be screened for STDs and discuss the results with each other.  Check your body regularly for sores, blisters, rashes, or unusual discharge. If you notice any of these problems, visit your health care provider.  If you have symptoms of an infection or you are being treated for an STD, avoid sexual contact.  While having sex, use a condom. Make sure to: ? Use a condom every time you have vaginal, oral, or anal sex. Both females and males should wear condoms during oral sex. ? Keep condoms in place from the beginning to the end of sexual activity. ? Use a latex condom, if possible. Latex condoms offer the best protection. ? Use only water-based lubricants or oils to lubricate a condom. Using petroleum-based lubricants or oils will weaken the condom and increase the chance that it will break.  See your health care provider for regular screenings, exams, and tests for STDs.  Talk with your health care provider about the form of birth control (contraception) that is best for you.  Get vaccinated against hepatitis B and human papillomavirus (HPV).  If you are at risk of being infected with HIV (human immunodeficiency virus), talk with your health care provider about taking a prescription medicine to prevent HIV infection. You are considered at risk  for HIV if: ? You are a man who has sex with other men. ? You are a heterosexual man or woman who is sexually active with more than one partner. ? You take drugs by injection. ? You are sexually active with a partner who has HIV.  This information is not intended to replace advice given to you by your health care provider. Make sure you discuss any questions you have with your health care provider. Document Released: 08/03/2004 Document Revised: 11/10/2015 Document Reviewed: 05/16/2015 Elsevier Interactive Patient Education  2018 [employer]      IF you received an x-ray today, you will receive an invoice from [facility]. Please contact [facility] at [phone] with questions or concerns regarding your invoice.   IF you received labwork today, you will receive an invoice from [facility]. Please contact [facility] at [phone] with questions or concerns regarding your invoice.   Our billing staff will not be able to assist you with questions regarding bills from these companies.  You will be contacted with the lab results as soon as they are available. The fastest way to get your results is to activate your My Chart account. Instructions are located on the last page of this paperwork. If you have not heard from us regarding the results in 2 weeks, please contact this office.

## 2017-06-27 LAB — GC/CHLAMYDIA PROBE AMP
Chlamydia trachomatis, NAA: NEGATIVE
Neisseria gonorrhoeae by PCR: NEGATIVE

## 2017-06-27 LAB — TRICHOMONAS VAGINALIS, PROBE AMP: TRICH VAG BY NAA: NEGATIVE

## 2017-06-27 LAB — HIV ANTIBODY (ROUTINE TESTING W REFLEX): HIV SCREEN 4TH GENERATION: NONREACTIVE

## 2017-06-27 LAB — RPR: RPR: NONREACTIVE

## 2017-07-19 ENCOUNTER — Other Ambulatory Visit: Payer: Self-pay

## 2017-07-19 ENCOUNTER — Encounter (HOSPITAL_COMMUNITY): Payer: Self-pay | Admitting: Emergency Medicine

## 2017-07-19 ENCOUNTER — Emergency Department (HOSPITAL_COMMUNITY)
Admission: EM | Admit: 2017-07-19 | Discharge: 2017-07-19 | Disposition: A | Discharge status: Home / Self Care | Attending: Emergency Medicine | Admitting: Emergency Medicine

## 2017-07-19 ENCOUNTER — Encounter (HOSPITAL_COMMUNITY): Payer: Self-pay

## 2017-07-19 ENCOUNTER — Emergency Department (HOSPITAL_COMMUNITY)
Admission: EM | Admit: 2017-07-19 | Discharge: 2017-07-19 | Discharge status: Against Medical Advice | Attending: Emergency Medicine | Admitting: Emergency Medicine

## 2017-07-19 DIAGNOSIS — Z79899 Other long term (current) drug therapy: Secondary | ICD-10-CM | POA: Insufficient documentation

## 2017-07-19 DIAGNOSIS — Z23 Encounter for immunization: Secondary | ICD-10-CM | POA: Insufficient documentation

## 2017-07-19 DIAGNOSIS — Y998 Other external cause status: Secondary | ICD-10-CM | POA: Insufficient documentation

## 2017-07-19 DIAGNOSIS — Y929 Unspecified place or not applicable: Secondary | ICD-10-CM | POA: Diagnosis not present

## 2017-07-19 DIAGNOSIS — Y939 Activity, unspecified: Secondary | ICD-10-CM | POA: Diagnosis not present

## 2017-07-19 DIAGNOSIS — S6992XA Unspecified injury of left wrist, hand and finger(s), initial encounter: Secondary | ICD-10-CM | POA: Diagnosis present

## 2017-07-19 DIAGNOSIS — M79642 Pain in left hand: Secondary | ICD-10-CM | POA: Insufficient documentation

## 2017-07-19 DIAGNOSIS — Z5321 Procedure and treatment not carried out due to patient leaving prior to being seen by health care provider: Secondary | ICD-10-CM | POA: Insufficient documentation

## 2017-07-19 DIAGNOSIS — W268XXA Contact with other sharp object(s), not elsewhere classified, initial encounter: Secondary | ICD-10-CM | POA: Diagnosis not present

## 2017-07-19 DIAGNOSIS — S61412A Laceration without foreign body of left hand, initial encounter: Secondary | ICD-10-CM

## 2017-07-19 MED ORDER — LIDOCAINE HCL 1 % IJ SOLN: 30.0000 mL | Freq: Once | INTRAMUSCULAR | Status: DC

## 2017-07-19 MED ORDER — TETANUS-DIPHTH-ACELL PERTUSSIS 5-2.5-18.5 LF-MCG/0.5 IM SUSP
0.5000 mL | Freq: Once | INTRAMUSCULAR | Status: AC
  Administered 2017-07-19: 0.5 mL via INTRAMUSCULAR
  Filled 2017-07-19: qty 0.5

## 2017-07-19 NOTE — ED Triage Notes (Signed)
Pt verbalize laceration to left palm from knife last night; has not had tetanus within 5 years.

## 2017-07-19 NOTE — Discharge Instructions (Signed)
Keep area clean and dry. You can wash with soap and water Change bandage at least once daily, more if it is dirty Watch for signs of infection (redness, drainage) Have stitches removed in 7-10 days. There are three total.

## 2017-07-19 NOTE — ED Triage Notes (Addendum)
Pt reports lacerating his palm on the left hand w/ a pocket knife today. Pt denies recent tetanus shot. Pt refuses receiving one despite explaining the benefits. Lac cleaned w/ sterile saline and bleeding controlled w/ pressure dressing.

## 2017-07-19 NOTE — ED Provider Notes (Signed)
[facility]-EMERGENCY DEPT Provider Note   CSN: [id number] Arrival date & time: 07/19/17  1554     History   Chief Complaint Chief Complaint  Patient presents with  . Laceration    HPI Evan Turner is a 22 y.o. male who presents with a left hand laceration.  He states that he was cut with a blade similar to a box cutter on his left palm last night at about 3 AM.  He came to the emergency department at about 5 AM however left because the wait was too long.  He represented today because of ongoing pain to the hand and wound dehiscence.  He is able to move all his fingers.  He denies any numbness or weakness.  He is not up-to-date on tetanus  HPI  History reviewed. No pertinent past medical history.  Patient Active Problem List   Diagnosis Date Noted  . Acute upper respiratory infection 07/28/2015    History reviewed. No pertinent surgical history.     Home Medications    Prior to Admission medications   Medication Sig Start Date End Date Taking? Authorizing Provider  cetirizine (ZYRTEC) 10 MG tablet Take 1 tablet (10 mg total) by mouth daily. 06/26/17   Wallis BambergMani, Mario, PA-C  pseudoephedrine (SUDAFED 12 HOUR) 120 MG 12 hr tablet Take 1 tablet (120 mg total) by mouth 2 (two) times daily. 06/26/17   Wallis BambergMani, Mario, PA-C    Family History Family History  Problem Relation Age of Onset  . Hypertension Mother     Social History Social History   Tobacco Use  . Smoking status: Never Smoker  . Smokeless tobacco: Never Used  Substance Use Topics  . Alcohol use: No    Alcohol/week: 0.0 oz  . Drug use: No     Allergies   Patient has no known allergies.   Review of Systems Review of Systems  Musculoskeletal: Positive for arthralgias. Negative for joint swelling.  Skin: Positive for wound.  Neurological: Negative for weakness and numbness.     Physical Exam Updated Vital Signs BP 126/75 (BP Location: Right Arm)   Pulse 75   Temp 98.4 F (36.9  C) (Oral)   Resp 16   Ht 6' (1.829 m)   Wt 88.5 kg (195 lb)   SpO2 97%   BMI 26.45 kg/m   Physical Exam  Constitutional: He is oriented to person, place, and time. He appears well-developed and well-nourished. No distress.  HENT:  Head: Normocephalic and atraumatic.  Eyes: Conjunctivae are normal. Pupils are equal, round, and reactive to light. Right eye exhibits no discharge. Left eye exhibits no discharge. No scleral icterus.  Neck: Normal range of motion.  Cardiovascular: Normal rate.  Pulmonary/Chest: Effort normal. No respiratory distress.  Abdominal: He exhibits no distension.  Musculoskeletal:  Left hand: ~4cm linear laceration over the left palm which is superficial appearing but opens when the patient moves his fingers. He has FROM of all fingers and his thumb. No bleeding. N/V intact.  Neurological: He is alert and oriented to person, place, and time.  Skin: Skin is warm and dry.  Psychiatric: He has a normal mood and affect. His behavior is normal.  Nursing note and vitals reviewed.   ED Treatments / Results  Labs (all labs ordered are listed, but only abnormal results are displayed) Labs Reviewed - No data to display  EKG  EKG Interpretation None       Radiology No results found.  Procedures .Marland Kitchen.Laceration Repair Date/Time: 07/19/2017  6:41 PM Performed by: Bethel Born, PA-C Authorized by: Bethel Born, PA-C   Consent:    Consent obtained:  Verbal   Consent given by:  Patient   Risks discussed:  Infection and pain   Alternatives discussed:  No treatment Anesthesia (see MAR for exact dosages):    Anesthesia method:  Local infiltration   Local anesthetic:  Lidocaine 2% WITH epi Laceration details:    Location:  Hand   Hand location:  L palm   Length (cm):  4   Depth (mm):  3 Repair type:    Repair type:  Simple Pre-procedure details:    Preparation:  Patient was prepped and draped in usual sterile fashion Exploration:    Hemostasis  achieved with:  Direct pressure   Wound exploration: wound explored through full range of motion and entire depth of wound probed and visualized     Wound extent: no foreign bodies/material noted, no muscle damage noted and no tendon damage noted     Contaminated: no   Treatment:    Area cleansed with:  Shur-Clens   Amount of cleaning:  Extensive   Irrigation solution:  Sterile saline   Irrigation volume:  30cc   Irrigation method:  Pressure wash   Visualized foreign bodies/material removed: no   Skin repair:    Repair method:  Sutures   Suture size:  4-0   Suture material:  Nylon   Suture technique:  Simple interrupted   Number of sutures:  3 Approximation:    Approximation:  Loose   Vermilion border: well-aligned   Post-procedure details:    Dressing:  Sterile dressing   Patient tolerance of procedure:  Tolerated well, no immediate complications   (including critical care time)    Medications Ordered in ED Medications  Tdap (BOOSTRIX) injection 0.5 mL (0.5 mLs Intramuscular Given 07/19/17 1814)     Initial Impression / Assessment and Plan / ED Course  I have reviewed the triage vital signs and the nursing notes.  Pertinent labs & imaging results that were available during my care of the patient were reviewed by me and considered in my medical decision making (see chart for details).  22 year old presents with left hand laceration which is ~15 hours old on my evaluation. Discussed risks vs benefits of laceration repair >12 hours. He was agreeable to wound approximation which is reasonable considering wound has continued to open up with hand movement. Wound was copiously irrigated and three sutures were placed. He was advised to have stitches removed in 7-10 days. Wound care was discussed. He was given strict return precautions. Tetanus was updated.  Final Clinical Impressions(s) / ED Diagnoses   Final diagnoses:  Laceration of left hand without foreign body, initial  encounter    ED Discharge Orders    None       Bethel Born, PA-C 07/19/17 1846    Shaune Pollack, MD 07/20/17 [phone]

## 2017-07-28 ENCOUNTER — Encounter (HOSPITAL_COMMUNITY): Payer: Self-pay | Admitting: *Deleted

## 2017-07-28 ENCOUNTER — Ambulatory Visit (HOSPITAL_COMMUNITY): Admission: EM | Admit: 2017-07-28 | Discharge: 2017-07-28 | Disposition: A | Discharge status: Home / Self Care

## 2017-07-28 ENCOUNTER — Other Ambulatory Visit: Payer: Self-pay

## 2017-07-28 DIAGNOSIS — S61412A Laceration without foreign body of left hand, initial encounter: Secondary | ICD-10-CM

## 2017-07-28 DIAGNOSIS — Z4802 Encounter for removal of sutures: Secondary | ICD-10-CM

## 2017-07-28 NOTE — ED Triage Notes (Signed)
Per pt he is here for suture removal, per pt his wound still hurts and would like to talk with the provider

## 2017-07-28 NOTE — ED Provider Notes (Signed)
I was asked by nursing staff to evaluate the wound on his hand post suture removal.  Evaluation reveals a well approximated wound without pus, erythema, fluctuance or swelling.  Radial pulse was normal.  I feel this wound is healed and there is no further need for medical evaluation at this time.  We provided him with a work note stating that he can return to work without restriction and advised that he keep the wound moist with either Vaseline or topical antibiotic. [name], MS, PA-C 6:11 PM, 07/28/2017     [name], [name], PA-C 07/28/17 1811

## 2017-09-19 ENCOUNTER — Encounter (HOSPITAL_COMMUNITY): Payer: Self-pay | Admitting: Emergency Medicine

## 2017-09-19 ENCOUNTER — Ambulatory Visit (HOSPITAL_COMMUNITY)
Admission: EM | Admit: 2017-09-19 | Discharge: 2017-09-19 | Disposition: A | Discharge status: Home / Self Care | Attending: Family Medicine | Admitting: Family Medicine

## 2017-09-19 DIAGNOSIS — Z113 Encounter for screening for infections with a predominantly sexual mode of transmission: Secondary | ICD-10-CM | POA: Diagnosis present

## 2017-09-19 DIAGNOSIS — R202 Paresthesia of skin: Secondary | ICD-10-CM | POA: Insufficient documentation

## 2017-09-19 LAB — POCT I-STAT, CHEM 8
BUN: 15 mg/dL (ref 6–20)
CALCIUM ION: 1.24 mmol/L (ref 1.15–1.40)
CHLORIDE: 103 mmol/L (ref 101–111)
CREATININE: 1.3 mg/dL — AB (ref 0.61–1.24)
GLUCOSE: 94 mg/dL (ref 65–99)
HCT: 43 % (ref 39.0–52.0)
Hemoglobin: 14.6 g/dL (ref 13.0–17.0)
POTASSIUM: 3.7 mmol/L (ref 3.5–5.1)
Sodium: 140 mmol/L (ref 135–145)
TCO2: 26 mmol/L (ref 22–32)

## 2017-09-19 NOTE — Discharge Instructions (Addendum)
The blood testing performed this evening did not show any obvious cause of your symptoms (paresthesias). The best course of action will be for you to establish care with a primary physician (see information given).  We have sent testing for sexually transmitted infections. We will notify you of any positive results once they are received. If required, we will prescribe any medications you might need.

## 2017-09-19 NOTE — ED Triage Notes (Signed)
Pt states "sometimes I feel like my arms and my legs fall asleep" "I just want to make sure everything is flowing, my blood is flowing correctly".

## 2017-09-20 LAB — URINE CYTOLOGY ANCILLARY ONLY
CHLAMYDIA, DNA PROBE: NEGATIVE
NEISSERIA GONORRHEA: NEGATIVE
Trichomonas: NEGATIVE

## 2017-09-27 NOTE — ED Provider Notes (Signed)
[facility]   [id number] 09/19/17 Arrival Time: 4098  ASSESSMENT & PLAN:  1. Paresthesia   2. Screening for STDs (sexually transmitted diseases)    Plans to establish care with PCP for further workup of very intermittent paresthesia of extremities. Reassured that there is no sign of any serious medical problem.  Pending: Labs Reviewed  POCT I-STAT, CHEM 8 - Abnormal; Notable for the following components:      Result Value   Creatinine, Ser 1.30 (*)    All other components within normal limits  URINE CYTOLOGY ANCILLARY ONLY  Encouraged adequate hydration and sleep.  As for STD screening, we will notify of any positive results.  Reviewed expectations re: course of current medical issues. Questions answered. Outlined signs and symptoms indicating need for more acute intervention. Patient verbalized understanding. After Visit Summary given.   SUBJECTIVE:  Evan Reeder Lattanzio Montez Hageman. is a 22 y.o. male who presents with complaint of intermittently "feeling like my hands and sometimes my feet fall asleep". Tingling. Usually lasts for several minutes then resolves. No pattern reported/determined. No new medications. H/O similar in the distant past. Has been noticing on/off over the past few months. Admits to school stress and lack of adequate sleep; questions relation.   Also requests STD screening. Without symptoms.   ROS: As per HPI.  OBJECTIVE:  Vitals:   09/19/17 1924  BP: 135/86  Pulse: 77  Resp: 18  Temp: 98.1 F (36.7 C)  SpO2: 100%     General appearance: alert, cooperative, appears stated age and no distress Throat: lips, mucosa, and tongue normal; teeth and gums normal CV: RRR; normal extremity capillary refill Back: no CVA tenderness Abdomen: soft, non-tender; bowel sounds normal; no masses or organomegaly; no guarding or rebound tenderness GU: declines Skin: warm and dry Neuro: normal extremity sensation and strength Psychological:  Alert and  cooperative. Normal mood and affect.  Results for orders placed or performed during the hospital encounter of 09/19/17  I-STAT, chem 8  Result Value Ref Range   Sodium 140 135 - 145 mmol/L   Potassium 3.7 3.5 - 5.1 mmol/L   Chloride 103 101 - 111 mmol/L   BUN 15 6 - 20 mg/dL   Creatinine, Ser 1.19 (H) 0.61 - 1.24 mg/dL   Glucose, Bld 94 65 - 99 mg/dL   Calcium, Ion 1.47 8.29 - 1.40 mmol/L   TCO2 26 22 - 32 mmol/L   Hemoglobin 14.6 13.0 - 17.0 g/dL   HCT 56.2 13.0 - 86.5 %  Urine cytology ancillary only  Result Value Ref Range   Chlamydia Negative    Neisseria gonorrhea Negative    Trichomonas Negative     Labs Reviewed  POCT I-STAT, CHEM 8 - Abnormal; Notable for the following components:      Result Value   Creatinine, Ser 1.30 (*)    All other components within normal limits  URINE CYTOLOGY ANCILLARY ONLY    No Known Allergies   Family History  Problem Relation Age of Onset  . Hypertension Mother    Social History   Socioeconomic History  . Marital status: Single    Spouse name: Not on file  . Number of children: Not on file  . Years of education: Not on file  . Highest education level: Not on file  Occupational History  . Not on file  Social Needs  . Financial resource strain: Not on file  . Food insecurity:    Worry: Not on file    Inability:  Not on file  . Transportation needs:    Medical: Not on file    Non-medical: Not on file  Tobacco Use  . Smoking status: Never Smoker  . Smokeless tobacco: Never Used  Substance and Sexual Activity  . Alcohol use: No    Alcohol/week: 0.0 oz  . Drug use: No  . Sexual activity: Yes  Lifestyle  . Physical activity:    Days per week: Not on file    Minutes per session: Not on file  . Stress: Not on file  Relationships  . Social connections:    Talks on phone: Not on file    Gets together: Not on file    Attends religious service: Not on file    Active member of club or organization: Not on file    Attends  meetings of clubs or organizations: Not on file    Relationship status: Not on file  . Intimate partner violence:    Fear of current or ex partner: Not on file    Emotionally abused: Not on file    Physically abused: Not on file    Forced sexual activity: Not on file  Other Topics Concern  . Not on file  Social History Narrative  . Not on file          Mardella LaymanHagler, Evan Dede, Evan Turner 09/27/17 [phone]

## 2018-04-03 ENCOUNTER — Encounter (HOSPITAL_COMMUNITY): Payer: Self-pay | Admitting: *Deleted

## 2018-04-03 ENCOUNTER — Other Ambulatory Visit: Payer: Self-pay

## 2018-04-03 ENCOUNTER — Emergency Department (HOSPITAL_COMMUNITY)
Admission: EM | Admit: 2018-04-03 | Discharge: 2018-04-03 | Disposition: A | Discharge status: Home / Self Care | Attending: Emergency Medicine | Admitting: Emergency Medicine

## 2018-04-03 DIAGNOSIS — S161XXA Strain of muscle, fascia and tendon at neck level, initial encounter: Secondary | ICD-10-CM | POA: Insufficient documentation

## 2018-04-03 DIAGNOSIS — Y999 Unspecified external cause status: Secondary | ICD-10-CM | POA: Diagnosis not present

## 2018-04-03 DIAGNOSIS — Y92410 Unspecified street and highway as the place of occurrence of the external cause: Secondary | ICD-10-CM | POA: Diagnosis not present

## 2018-04-03 DIAGNOSIS — S39012A Strain of muscle, fascia and tendon of lower back, initial encounter: Secondary | ICD-10-CM | POA: Insufficient documentation

## 2018-04-03 DIAGNOSIS — S3992XA Unspecified injury of lower back, initial encounter: Secondary | ICD-10-CM | POA: Insufficient documentation

## 2018-04-03 DIAGNOSIS — Y9389 Activity, other specified: Secondary | ICD-10-CM | POA: Insufficient documentation

## 2018-04-03 DIAGNOSIS — T148XXA Other injury of unspecified body region, initial encounter: Secondary | ICD-10-CM

## 2018-04-03 DIAGNOSIS — S199XXA Unspecified injury of neck, initial encounter: Secondary | ICD-10-CM | POA: Diagnosis present

## 2018-04-03 MED ORDER — MELOXICAM 15 MG PO TABS: 15.0000 mg | ORAL_TABLET | Freq: Every day | ORAL | 0 refills | Status: DC

## 2018-04-03 MED ORDER — CYCLOBENZAPRINE HCL 10 MG PO TABS: 5.0000 mg | ORAL_TABLET | Freq: Two times a day (BID) | ORAL | 0 refills | Status: DC | PRN
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
## 2018-04-03 NOTE — Discharge Instructions (Signed)

## 2018-05-11 ENCOUNTER — Other Ambulatory Visit: Payer: Self-pay

## 2018-05-11 ENCOUNTER — Encounter (HOSPITAL_COMMUNITY): Payer: Self-pay | Admitting: Emergency Medicine

## 2018-05-11 ENCOUNTER — Ambulatory Visit (HOSPITAL_COMMUNITY)
Admission: EM | Admit: 2018-05-11 | Discharge: 2018-05-11 | Disposition: A | Discharge status: Home / Self Care | Attending: Emergency Medicine | Admitting: Emergency Medicine

## 2018-05-11 DIAGNOSIS — Z8249 Family history of ischemic heart disease and other diseases of the circulatory system: Secondary | ICD-10-CM | POA: Insufficient documentation

## 2018-05-11 DIAGNOSIS — R21 Rash and other nonspecific skin eruption: Secondary | ICD-10-CM

## 2018-05-11 DIAGNOSIS — R1084 Generalized abdominal pain: Secondary | ICD-10-CM | POA: Insufficient documentation

## 2018-05-11 LAB — POCT URINALYSIS DIP (DEVICE)
BILIRUBIN URINE: NEGATIVE
Glucose, UA: NEGATIVE mg/dL
HGB URINE DIPSTICK: NEGATIVE
Ketones, ur: NEGATIVE mg/dL
LEUKOCYTES UA: NEGATIVE
NITRITE: NEGATIVE
Protein, ur: NEGATIVE mg/dL
SPECIFIC GRAVITY, URINE: 1.025 (ref 1.005–1.030)
Urobilinogen, UA: 0.2 mg/dL (ref 0.0–1.0)
pH: 6.5 (ref 5.0–8.0)

## 2018-05-11 MED ORDER — OMEPRAZOLE 20 MG PO CPDR: 20.0000 mg | DELAYED_RELEASE_CAPSULE | Freq: Every day | ORAL | 0 refills | Status: DC

## 2018-05-11 NOTE — ED Triage Notes (Addendum)
Denies nausea, vomiting or diarrhea.  Reports stomach not feeling normal.  Not really pain, just not normal  Patient also has questions about rash in genital area

## 2018-05-11 NOTE — Discharge Instructions (Addendum)
We will start with this medication- omeprazole- to see if it is helpful with your abdominal pain. Take daily.  Monitor your diet to see if there is any sources of food to noted that may be causing symptoms.  May use treatment for jock itch if rash recurs.  Will notify you of any positive findings from STD screening and if any changes to treatment are needed.   If symptoms worsen or do not improve in the next week please call and establish with a primary care provider as you may need further evaluation and treatment.  Return sooner or go to the ER if develop increased pain, nausea, vomiting or fevers.

## 2018-05-11 NOTE — ED Provider Notes (Signed)
[facility]    CSN: [id number] Arrival date & time: 05/11/18  1707     History   Chief Complaint Chief Complaint  Patient presents with  . Abdominal Pain    HPI Evan Turner Hageman. is a 22 y.o. male.   Evan Turner presents with complaints of intermittent generalized abdominal discomfort. Started approximately 1 week ago. Comes and goes. Denies current pain. No fevers. No nausea or vomiting. No fevers. Denies urinary symptoms. States eating can help the pain. No known trigger. Denies any change in bowel habits, has a BM approximately every other day, denies straining to have BM. Last BM yesterday. No blood in stool. No recent travel, no contaminated water intake. Hasn't taken any medications for symptoms. Also states he has a rash to his penis which he noticed in the past week. Itches. Has improved. No longer itchy. Hasn't spread. States had similar in the past which resolved without treatment. No redness or pain. No bumps sores or lesions. No redness, swelling or pain to scrotum or testicles. Sexually active with 1 partner, doesn't always use condoms. Denies penile discharge. Denies any known STD exposure. Denies any heavy physical activity/sweating or swimming. Without contributing medical history.      ROS per HPI.      History reviewed. No pertinent past medical history.  Patient Active Problem List   Diagnosis Date Noted  . Acute upper respiratory infection 07/28/2015    History reviewed. No pertinent surgical history.     Home Medications    Prior to Admission medications   Medication Sig Start Date End Date Taking? Authorizing Provider  omeprazole (PRILOSEC) 20 MG capsule Take 1 capsule (20 mg total) by mouth daily. 05/11/18   Georgetta Haber, NP    Family History Family History  Problem Relation Age of Onset  . Hypertension Mother     Social History Social History   Tobacco Use  . Smoking status: Never Smoker  . Smokeless tobacco: Never  Used  Substance Use Topics  . Alcohol use: No    Alcohol/week: 0.0 standard drinks  . Drug use: No     Allergies   Patient has no known allergies.   Review of Systems Review of Systems   Physical Exam Triage Vital Signs ED Triage Vitals  Enc Vitals Group     BP 05/11/18 1800 134/75     Pulse Rate 05/11/18 1800 63     Resp 05/11/18 1800 16     Temp 05/11/18 1800 98.2 F (36.8 C)     Temp Source 05/11/18 1800 Oral     SpO2 05/11/18 1800 100 %     Weight --      Height --      Head Circumference --      Peak Flow --      Pain Score 05/11/18 1758 6     Pain Loc --      Pain Edu? --      Excl. in GC? --    No data found.  Updated Vital Signs BP 134/75 (BP Location: Left Arm) Comment (BP Location): large cuff  Pulse 63   Temp 98.2 F (36.8 C) (Oral)   Resp 16   SpO2 100%   Visual Acuity Right Eye Distance:   Left Eye Distance:   Bilateral Distance:    Right Eye Near:   Left Eye Near:    Bilateral Near:     Physical Exam  Constitutional: He is oriented to person, place, and  time. He appears well-developed and well-nourished.  Cardiovascular: Normal rate and regular rhythm.  Pulmonary/Chest: Effort normal and breath sounds normal.  Abdominal: Soft. Bowel sounds are normal. There is no tenderness. There is no rigidity, no rebound, no guarding, no CVA tenderness, no tenderness at McBurney's point and negative Murphy's sign.  Genitourinary: Testes normal and penis normal. Circumcised.  Genitourinary Comments: Very slight dry skin to anterior penile shaft, no rash, lesions, sores; nontender  Neurological: He is alert and oriented to person, place, and time.  Skin: Skin is warm and dry.     UC Treatments / Results  Labs (all labs ordered are listed, but only abnormal results are displayed) Labs Reviewed  POCT URINALYSIS DIP (DEVICE)  URINE CYTOLOGY ANCILLARY ONLY    EKG None  Radiology No results found.  Procedures Procedures (including critical  care time)  Medications Ordered in UC Medications - No data to display  Initial Impression / Assessment and Plan / UC Course  I have reviewed the triage vital signs and the nursing notes.  Pertinent labs & imaging results that were available during my care of the patient were reviewed by me and considered in my medical decision making (see chart for details).     No acute abdominal findings on exam. Non toxic in appearance. Eating and drinking without difficulty. Discussed tinea/ jock itch potential with penile rash which is improving, hygiene discussed. Urine cytology pending as well. Will try omeprazole for potential ulcers or reflux symptoms. Encouraged to continue to monitor symptoms and correlate with intake to see if any known triggers. Return precautions provided. Follow up with PCP as may need further eval if symptoms persist. Patient verbalized understanding and agreeable to plan.    Final Clinical Impressions(s) / UC Diagnoses   Final diagnoses:  Generalized abdominal pain  Penile rash     Discharge Instructions     We will start with this medication- omeprazole- to see if it is helpful with your abdominal pain. Take daily.  Monitor your diet to see if there is any sources of food to noted that may be causing symptoms.  May use treatment for jock itch if rash recurs.  Will notify you of any positive findings from STD screening and if any changes to treatment are needed.   If symptoms worsen or do not improve in the next week please call and establish with a primary care provider as you may need further evaluation and treatment.  Return sooner or go to the ER if develop increased pain, nausea, vomiting or fevers.        ED Prescriptions    Medication Sig Dispense Auth. Provider   omeprazole (PRILOSEC) 20 MG capsule Take 1 capsule (20 mg total) by mouth daily. 30 capsule Georgetta Haber, NP     Controlled Substance Prescriptions [place] Controlled Substance Registry  consulted? Not Applicable   Georgetta Haber, NP 05/11/18 [phone]

## 2018-05-13 LAB — URINE CYTOLOGY ANCILLARY ONLY
CHLAMYDIA, DNA PROBE: NEGATIVE
Neisseria Gonorrhea: NEGATIVE
TRICH (WINDOWPATH): NEGATIVE

## 2018-06-13 ENCOUNTER — Other Ambulatory Visit: Payer: Self-pay

## 2018-06-13 ENCOUNTER — Ambulatory Visit (HOSPITAL_COMMUNITY)
Admission: EM | Admit: 2018-06-13 | Discharge: 2018-06-13 | Disposition: A | Discharge status: Home / Self Care | Attending: Family Medicine | Admitting: Family Medicine

## 2018-06-13 ENCOUNTER — Encounter (HOSPITAL_COMMUNITY): Payer: Self-pay

## 2018-06-13 DIAGNOSIS — Z79899 Other long term (current) drug therapy: Secondary | ICD-10-CM | POA: Diagnosis not present

## 2018-06-13 DIAGNOSIS — Z113 Encounter for screening for infections with a predominantly sexual mode of transmission: Secondary | ICD-10-CM | POA: Diagnosis not present

## 2018-06-13 DIAGNOSIS — N4889 Other specified disorders of penis: Secondary | ICD-10-CM | POA: Diagnosis not present

## 2018-06-13 MED ORDER — AZITHROMYCIN 250 MG PO TABS
ORAL_TABLET | ORAL | Status: AC
  Filled 2018-06-13: qty 4

## 2018-06-13 MED ORDER — AZITHROMYCIN 250 MG PO TABS
1000.0000 mg | ORAL_TABLET | Freq: Once | ORAL | Status: AC
  Administered 2018-06-13: 1000 mg via ORAL

## 2018-06-13 NOTE — ED Triage Notes (Signed)
Pt cc he state he would like to be tested for STD.

## 2018-06-13 NOTE — Discharge Instructions (Signed)
We have treated you today for chlamydia, with azithromycin. Please refrain from sexual activity for 7 days while medicine is clearing infection. ° °We are testing you for Gonorrhea, Chlamydia and Trichomonas. We will call you if anything is positive and let you know if you require any further treatment. Please inform partner of any positive results. ° °Please return if symptoms not improving with treatment, development of fever, nausea, vomiting, abdominal pain, scrotal pain. °

## 2018-06-14 LAB — URINE CYTOLOGY ANCILLARY ONLY
CHLAMYDIA, DNA PROBE: NEGATIVE
NEISSERIA GONORRHEA: NEGATIVE
TRICH (WINDOWPATH): NEGATIVE

## 2018-06-14 NOTE — ED Provider Notes (Signed)
[facility]    CSN: [id number] Arrival date & time: 06/13/18  1242     History   Chief Complaint Chief Complaint  Patient presents with  . SEXUALLY TRANSMITTED DISEASE    HPI Evan Turner. is a 22 y.o. male no significant past medical history presenting today for STD screening.  Patient states that he has had some itching sensations to the tip of his penis for the past few days and would like to be screened for this.  He denies any penile discharge, but states that that he is not fully sure if he has or has not.  He denies any tingling or burning with urination.  Denies abdominal pain.  Denies any rashes or lesions.  Denies any known exposures.  HPI  History reviewed. No pertinent past medical history.  Patient Active Problem List   Diagnosis Date Noted  . Acute upper respiratory infection 07/28/2015    History reviewed. No pertinent surgical history.     Home Medications    Prior to Admission medications   Medication Sig Start Date End Date Taking? Authorizing Provider  omeprazole (PRILOSEC) 20 MG capsule Take 1 capsule (20 mg total) by mouth daily. 05/11/18   Georgetta Haber, NP    Family History Family History  Problem Relation Age of Onset  . Hypertension Mother     Social History Social History   Tobacco Use  . Smoking status: Never Smoker  . Smokeless tobacco: Never Used  Substance Use Topics  . Alcohol use: No    Alcohol/week: 0.0 standard drinks  . Drug use: No     Allergies   Patient has no known allergies.   Review of Systems Review of Systems  Constitutional: Negative for fever.  HENT: Negative for sore throat.   Respiratory: Negative for shortness of breath.   Cardiovascular: Negative for chest pain.  Gastrointestinal: Negative for abdominal pain, nausea and vomiting.  Genitourinary: Negative for difficulty urinating, discharge, dysuria, frequency, penile pain, penile swelling, scrotal swelling and testicular  pain.  Skin: Negative for rash.  Neurological: Negative for dizziness, light-headedness and headaches.     Physical Exam Triage Vital Signs ED Triage Vitals  Enc Vitals Group     BP 06/13/18 1357 137/72     Pulse Rate 06/13/18 1357 69     Resp 06/13/18 1357 18     Temp 06/13/18 1357 98 F (36.7 C)     Temp Source 06/13/18 1357 Oral     SpO2 --      Weight 06/13/18 1358 205 lb (93 kg)     Height --      Head Circumference --      Peak Flow --      Pain Score 06/13/18 1358 1     Pain Loc --      Pain Edu? --      Excl. in GC? --    No data found.  Updated Vital Signs BP 137/72   Pulse 69   Temp 98 F (36.7 C) (Oral)   Resp 18   Wt 205 lb (93 kg)   BMI 27.80 kg/m   Visual Acuity Right Eye Distance:   Left Eye Distance:   Bilateral Distance:    Right Eye Near:   Left Eye Near:    Bilateral Near:     Physical Exam  Constitutional: He is oriented to person, place, and time. He appears well-developed and well-nourished.  No acute distress  HENT:  Head: Normocephalic  and atraumatic.  Nose: Nose normal.  Eyes: Conjunctivae are normal.  Neck: Neck supple.  Cardiovascular: Normal rate.  Pulmonary/Chest: Effort normal. No respiratory distress.  Abdominal: He exhibits no distension.  Genitourinary:  Genitourinary Comments: deferred  Musculoskeletal: Normal range of motion.  Neurological: He is alert and oriented to person, place, and time.  Skin: Skin is warm and dry.  Psychiatric: He has a normal mood and affect.  Nursing note and vitals reviewed.    UC Treatments / Results  Labs (all labs ordered are listed, but only abnormal results are displayed) Labs Reviewed  URINE CYTOLOGY ANCILLARY ONLY    EKG None  Radiology No results found.  Procedures Procedures (including critical care time)  Medications Ordered in UC Medications  azithromycin (ZITHROMAX) tablet 1,000 mg (1,000 mg Oral Given 06/13/18 1437)    Initial Impression / Assessment and  Plan / UC Course  I have reviewed the triage vital signs and the nursing notes.  Pertinent labs & imaging results that were available during my care of the patient were reviewed by me and considered in my medical decision making (see chart for details).     Urine cytology obtained.  Discussed with patient empiric treatment versus awaiting results given his symptoms.  Patient opted for treating today for chlamydia with azithromycin, but holding off on gonorrhea.  Discussed holding off for 7 full days for treatment before returning to sexual intercourse.  Advised to continue to monitor for the development of rashes, follow-up if symptoms not resolving.  Will call patient with results and provide any further treatment.Discussed strict return precautions. Patient verbalized understanding and is agreeable with plan.  Final Clinical Impressions(s) / UC Diagnoses   Final diagnoses:  Screen for STD (sexually transmitted disease)     Discharge Instructions     We have treated you today for chlamydia, with azithromycin. Please refrain from sexual activity for 7 days while medicine is clearing infection.  We are testing you for Gonorrhea, Chlamydia and Trichomonas. We will call you if anything is positive and let you know if you require any further treatment. Please inform partner of any positive results.  Please return if symptoms not improving with treatment, development of fever, nausea, vomiting, abdominal pain, scrotal pain.   ED Prescriptions    None     Controlled Substance Prescriptions [place] Controlled Substance Registry consulted? Not Applicable   Lew DawesWieters, Samiksha Pellicano C, [place]PA-C 06/14/18 [phone]

## 2018-06-15 ENCOUNTER — Encounter (HOSPITAL_COMMUNITY): Payer: Self-pay | Admitting: Emergency Medicine

## 2018-06-15 ENCOUNTER — Ambulatory Visit (HOSPITAL_COMMUNITY)
Admission: EM | Admit: 2018-06-15 | Discharge: 2018-06-15 | Disposition: A | Discharge status: Home / Self Care | Attending: Internal Medicine | Admitting: Internal Medicine

## 2018-06-15 DIAGNOSIS — Z79899 Other long term (current) drug therapy: Secondary | ICD-10-CM | POA: Diagnosis not present

## 2018-06-15 DIAGNOSIS — R82998 Other abnormal findings in urine: Secondary | ICD-10-CM

## 2018-06-15 DIAGNOSIS — L298 Other pruritus: Secondary | ICD-10-CM | POA: Insufficient documentation

## 2018-06-15 DIAGNOSIS — Z8249 Family history of ischemic heart disease and other diseases of the circulatory system: Secondary | ICD-10-CM | POA: Insufficient documentation

## 2018-06-15 LAB — POCT URINALYSIS DIP (DEVICE)
BILIRUBIN URINE: NEGATIVE
GLUCOSE, UA: NEGATIVE mg/dL
HGB URINE DIPSTICK: NEGATIVE
Ketones, ur: NEGATIVE mg/dL
Nitrite: NEGATIVE
Protein, ur: NEGATIVE mg/dL
Specific Gravity, Urine: 1.025 (ref 1.005–1.030)
UROBILINOGEN UA: 0.2 mg/dL (ref 0.0–1.0)
pH: 6.5 (ref 5.0–8.0)

## 2018-06-15 MED ORDER — SULFAMETHOXAZOLE-TRIMETHOPRIM 800-160 MG PO TABS: 1.0000 | ORAL_TABLET | Freq: Two times a day (BID) | ORAL | 0 refills | Status: AC

## 2018-06-15 NOTE — ED Triage Notes (Signed)
Pt states he was tested a few days ago for STDs and they all were negative, however he is still having "itching down there".

## 2018-06-15 NOTE — ED Notes (Signed)
At bedside for provider exam 

## 2018-06-15 NOTE — ED Provider Notes (Signed)
[facility]    CSN: [id number] Arrival date & time: 06/15/18  1041     History   Chief Complaint Chief Complaint  Patient presents with  . Itching    HPI Evan Turner. is a 22 y.o. male.   Pt present today because he is still having urethral itching, had neg STD testing last month when he was having those symptoms. On occasion has clear discharge,but denies dysuria. Does feel slight itch when he voids. He also never had clear discharge before, and this has not resolved. He denies low back pain, frequency, or hesitation when he voids     History reviewed. No pertinent past medical history.  Patient Active Problem List   Diagnosis Date Noted  . Acute upper respiratory infection 07/28/2015    History reviewed. No pertinent surgical history.     Home Medications    Prior to Admission medications   Medication Sig Start Date End Date Taking? Authorizing Provider  omeprazole (PRILOSEC) 20 MG capsule Take 1 capsule (20 mg total) by mouth daily. 05/11/18   Georgetta Haber, NP    Family History Family History  Problem Relation Age of Onset  . Hypertension Mother     Social History Social History   Tobacco Use  . Smoking status: Never Smoker  . Smokeless tobacco: Never Used  Substance Use Topics  . Alcohol use: No    Alcohol/week: 0.0 standard drinks  . Drug use: No     Allergies   Patient has no known allergies.   Review of Systems Review of Systems  Constitutional: Negative for chills, diaphoresis, fever and unexpected weight change.  Genitourinary: Negative for decreased urine volume, difficulty urinating, discharge, dysuria, enuresis, flank pain, frequency, genital sores, hematuria, penile pain, penile swelling, scrotal swelling, testicular pain and urgency.  Musculoskeletal: Negative for myalgias.  Skin: Negative for rash.     Physical Exam Triage Vital Signs ED Triage Vitals [06/15/18 1145]  Enc Vitals Group     BP  120/79     Pulse Rate 77     Resp 16     Temp 97.8 F (36.6 C)     Temp src      SpO2 100 %     Weight      Height      Head Circumference      Peak Flow      Pain Score 0     Pain Loc      Pain Edu?      Excl. in GC?    No data found.  Updated Vital Signs BP 120/79   Pulse 77   Temp 97.8 F (36.6 C)   Resp 16   SpO2 100%   Visual Acuity Right Eye Distance:   Left Eye Distance:   Bilateral Distance:    Right Eye Near:   Left Eye Near:    Bilateral Near:     Physical Exam  Constitutional: He is oriented to person, place, and time. He appears well-developed and well-nourished. No distress.  HENT:  Head: Normocephalic.  Right Ear: External ear normal.  Left Ear: External ear normal.  Nose: Nose normal.  Eyes: Conjunctivae are normal. No scleral icterus.  Neck: Neck supple.  Pulmonary/Chest: Effort normal.  Genitourinary: Penis normal. No penile tenderness.  Genitourinary Comments: Meatus is normal and no active discharge noted.   Musculoskeletal: Normal range of motion.  Neurological: He is alert and oriented to person, place, and time.  Skin: Skin is  warm and dry. No rash noted. He is not diaphoretic. No erythema. No pallor.  Psychiatric: He has a normal mood and affect. His behavior is normal. Judgment and thought content normal.  Nursing note and vitals reviewed.    UC Treatments / Results  Labs (all labs ordered are listed, but only abnormal results are displayed) UA shows small leuks, rest is neg.   EKG None  Radiology No results found.  Medications Ordered in UC Medications - No data to display  Initial Impression / Assessment and Plan / UC Course  I have reviewed the triage vital signs and the nursing notes. I reviewed his UA from 12/5 and he did not have any leukocytes then. Today his UA shows small leuks, so I started him on bactrim to cover UTI and sent urine for a culture.  Long time spent educating him on possible causes of UTI and his  symptoms, and we have testing him of all we can here. If his symptoms persists, he needs to get established with PCP I;ve attached in his AVS and may need to be sent to Urology.   Final Clinical Impressions(s) / UC Diagnoses   Final diagnoses:  None   Discharge Instructions   None    ED Prescriptions    None     Controlled Substance Prescriptions [place] Controlled Substance Registry consulted?    Garey HamRodriguez-Southworth, Pearce Littlefield, PA-C 06/15/18 1240

## 2018-06-15 NOTE — Discharge Instructions (Addendum)
1-You urine is showing small amount of white cells which you did not have last time, and this may signify you may have a urinary tract infection( bladder infection). I will sent out you urine to see if there is a bacteria that could be causing this, but since it will take 2 days to come back, in the mean time I will treat you with an antibiotic called Bactrim. Take it til gone.   2- You need to get established with a family practioner and Ms Jerrilyn CairoKim Harris so if your symptoms continue, you can be sent to a specialist, called Urologist.

## 2018-06-16 LAB — URINE CULTURE
Culture: NO GROWTH
SPECIAL REQUESTS: NORMAL

## 2018-07-10 DIAGNOSIS — F419 Anxiety disorder, unspecified: Secondary | ICD-10-CM | POA: Insufficient documentation

## 2018-07-10 DIAGNOSIS — R4586 Emotional lability: Secondary | ICD-10-CM

## 2018-07-10 HISTORY — DX: Anxiety disorder, unspecified: F41.9

## 2018-07-10 HISTORY — DX: Emotional lability: R45.86

## 2018-08-07 ENCOUNTER — Ambulatory Visit: Admitting: Family Medicine

## 2018-08-26 ENCOUNTER — Ambulatory Visit (HOSPITAL_COMMUNITY)
Admission: EM | Admit: 2018-08-26 | Discharge: 2018-08-26 | Disposition: A | Discharge status: Home / Self Care | Attending: Family Medicine | Admitting: Family Medicine

## 2018-08-26 ENCOUNTER — Encounter (HOSPITAL_COMMUNITY): Payer: Self-pay | Admitting: Emergency Medicine

## 2018-08-26 DIAGNOSIS — F419 Anxiety disorder, unspecified: Secondary | ICD-10-CM | POA: Insufficient documentation

## 2018-08-26 DIAGNOSIS — Z711 Person with feared health complaint in whom no diagnosis is made: Secondary | ICD-10-CM | POA: Insufficient documentation

## 2018-08-26 LAB — CBC WITH DIFFERENTIAL/PLATELET
Abs Immature Granulocytes: 0 10*3/uL (ref 0.00–0.07)
BASOS PCT: 1 %
Basophils Absolute: 0 10*3/uL (ref 0.0–0.1)
Eosinophils Absolute: 0.1 10*3/uL (ref 0.0–0.5)
Eosinophils Relative: 5 %
HEMATOCRIT: 47.9 % (ref 39.0–52.0)
HEMOGLOBIN: 15.6 g/dL (ref 13.0–17.0)
IMMATURE GRANULOCYTES: 0 %
LYMPHS ABS: 1.4 10*3/uL (ref 0.7–4.0)
LYMPHS PCT: 47 %
MCH: 27 pg (ref 26.0–34.0)
MCHC: 32.6 g/dL (ref 30.0–36.0)
MCV: 83 fL (ref 80.0–100.0)
MONOS PCT: 12 %
Monocytes Absolute: 0.4 10*3/uL (ref 0.1–1.0)
NEUTROS ABS: 1.1 10*3/uL — AB (ref 1.7–7.7)
Neutrophils Relative %: 35 %
Platelets: 215 10*3/uL (ref 150–400)
RBC: 5.77 MIL/uL (ref 4.22–5.81)
RDW: 13.2 % (ref 11.5–15.5)
WBC: 3 10*3/uL — ABNORMAL LOW (ref 4.0–10.5)
nRBC: 0 % (ref 0.0–0.2)

## 2018-08-26 LAB — BASIC METABOLIC PANEL
ANION GAP: 8 (ref 5–15)
BUN: 12 mg/dL (ref 6–20)
CALCIUM: 10 mg/dL (ref 8.9–10.3)
CHLORIDE: 104 mmol/L (ref 98–111)
CO2: 26 mmol/L (ref 22–32)
Creatinine, Ser: 1.31 mg/dL — ABNORMAL HIGH (ref 0.61–1.24)
GFR calc Af Amer: >60 mL/min (ref >60)
GFR calc non Af Amer: >60 mL/min (ref >60)
Glucose, Bld: 90 mg/dL (ref 70–99)
Potassium: 4.1 mmol/L (ref 3.5–5.1)
SODIUM: 138 mmol/L (ref 135–145)

## 2018-08-26 MED ORDER — HYDROXYZINE HCL 25 MG PO TABS: 25.0000 mg | ORAL_TABLET | Freq: Four times a day (QID) | ORAL | 0 refills | Status: DC

## 2018-08-26 NOTE — Discharge Instructions (Signed)
I am prescribing you some medication to use as needed for anxiety and depression. This medicine will male you drowsy.  You can try things to decrease your anxiety to include exercise, meditating.  I am doing some basic lab work to check a few things as you have requested.  Also doing STD testing.  We will call you with any abnormal results.  Follow up as needed for continued or worsening symptoms

## 2018-08-26 NOTE — ED Triage Notes (Signed)
Pt here for eval after having some anxiety recently surrounding his grandmothers death; pt sts some nausea; pt appears anxious and is poor historian

## 2018-08-27 LAB — URINE CYTOLOGY ANCILLARY ONLY
Chlamydia: NEGATIVE
NEISSERIA GONORRHEA: NEGATIVE
Trichomonas: NEGATIVE

## 2018-08-27 NOTE — ED Provider Notes (Signed)
[facility]    CSN: [id number] Arrival date & time: 08/26/18  1243     History   Chief Complaint Chief Complaint  Patient presents with  . Anxiety    HPI Evan Lu Loeza Montez Hageman. is a 23 y.o. male.   Patient is a 23 year old male who presents today with multiple different complaints.  He has had some anxiety since his grandmother passed a few weeks back.  He has had multiple episodes of what he feels to be anxiety attacks with heart racing, numbness and tingling in his extremities.  This comes on sporadically.  Denies any associated chest pain, shortness of breath.  He has not taken anything for symptoms.  Denies any suicidal ideations. He is requesting basic lab work and STD testing.  He reports he is just worried and would like an overall checkup to make sure he is okay. He has had some mild URI symptoms recently.  Denies any fever, chills, body aches, night sweats.  Denies any nausea, vomiting, diarrhea.  No urinary symptoms.  ROS per HPI      History reviewed. No pertinent past medical history.  Patient Active Problem List   Diagnosis Date Noted  . Acute upper respiratory infection 07/28/2015    History reviewed. No pertinent surgical history.     Home Medications    Prior to Admission medications   Medication Sig Start Date End Date Taking? Authorizing Provider  hydrOXYzine (ATARAX/VISTARIL) 25 MG tablet Take 1 tablet (25 mg total) by mouth every 6 (six) hours. 08/26/18   Dahlia Byes A, NP  omeprazole (PRILOSEC) 20 MG capsule Take 1 capsule (20 mg total) by mouth daily. 05/11/18   Georgetta Haber, NP    Family History Family History  Problem Relation Age of Onset  . Hypertension Mother     Social History Social History   Tobacco Use  . Smoking status: Never Smoker  . Smokeless tobacco: Never Used  Substance Use Topics  . Alcohol use: No    Alcohol/week: 0.0 standard drinks  . Drug use: No     Allergies   Patient has no known  allergies.   Review of Systems Review of Systems   Physical Exam Triage Vital Signs ED Triage Vitals [08/26/18 1315]  Enc Vitals Group     BP 133/82     Pulse Rate (!) 106     Resp 18     Temp 98 F (36.7 C)     Temp Source Temporal     SpO2 100 %     Weight      Height      Head Circumference      Peak Flow      Pain Score 0     Pain Loc      Pain Edu?      Excl. in GC?    No data found.  Updated Vital Signs BP 133/82 (BP Location: Right Arm)   Pulse (!) 106   Temp 98 F (36.7 C) (Temporal)   Resp 18   SpO2 100%   Visual Acuity Right Eye Distance:   Left Eye Distance:   Bilateral Distance:    Right Eye Near:   Left Eye Near:    Bilateral Near:     Physical Exam Vitals signs and nursing note reviewed.  Constitutional:      General: He is not in acute distress.    Appearance: Normal appearance. He is well-developed. He is not ill-appearing.  HENT:  Head: Normocephalic and atraumatic.     Right Ear: Tympanic membrane and ear canal normal.     Left Ear: Tympanic membrane and ear canal normal.     Nose: Nose normal.     Mouth/Throat:     Pharynx: Oropharynx is clear.  Eyes:     Conjunctiva/sclera: Conjunctivae normal.  Neck:     Musculoskeletal: Neck supple.  Cardiovascular:     Rate and Rhythm: Normal rate and regular rhythm.     Heart sounds: No murmur.  Pulmonary:     Effort: Pulmonary effort is normal. No respiratory distress.     Breath sounds: Normal breath sounds.  Abdominal:     Palpations: Abdomen is soft.     Tenderness: There is no abdominal tenderness.  Musculoskeletal: Normal range of motion.  Skin:    General: Skin is warm and dry.  Neurological:     Mental Status: He is alert.  Psychiatric:        Mood and Affect: Mood normal.      UC Treatments / Results  Labs (all labs ordered are listed, but only abnormal results are displayed) Labs Reviewed  CBC WITH DIFFERENTIAL/PLATELET - Abnormal; Notable for the following  components:      Result Value   WBC 3.0 (*)    Neutro Abs 1.1 (*)    All other components within normal limits  BASIC METABOLIC PANEL - Abnormal; Notable for the following components:   Creatinine, Ser 1.31 (*)    All other components within normal limits  URINE CYTOLOGY ANCILLARY ONLY    EKG None  Radiology No results found.  Procedures Procedures (including critical care time)  Medications Ordered in UC Medications - No data to display  Initial Impression / Assessment and Plan / UC Course  I have reviewed the triage vital signs and the nursing notes.  Pertinent labs & imaging results that were available during my care of the patient were reviewed by me and considered in my medical decision making (see chart for details).    Anxiety-  Exam benign. VSS, non toxic or ill appearing Patient does appear slightly anxious. Lab testing done as requested We will prescribe hydroxyzine to use as needed for panic and anxiety. Follow up as needed for continued or worsening symptoms  Final Clinical Impressions(s) / UC Diagnoses   Final diagnoses:  Anxiety  Physically well but worried     Discharge Instructions     I am prescribing you some medication to use as needed for anxiety and depression. This medicine will male you drowsy.  You can try things to decrease your anxiety to include exercise, meditating.  I am doing some basic lab work to check a few things as you have requested.  Also doing STD testing.  We will call you with any abnormal results.  Follow up as needed for continued or worsening symptoms     ED Prescriptions    Medication Sig Dispense Auth. Provider   hydrOXYzine (ATARAX/VISTARIL) 25 MG tablet Take 1 tablet (25 mg total) by mouth every 6 (six) hours. 12 tablet Dahlia Byes A, NP     Controlled Substance Prescriptions [place] Controlled Substance Registry consulted? Not Applicable   Evan Aris, NP 08/27/18 [phone]

## 2018-09-09 ENCOUNTER — Other Ambulatory Visit: Payer: Self-pay

## 2018-09-09 ENCOUNTER — Encounter (HOSPITAL_COMMUNITY): Payer: Self-pay

## 2018-09-09 ENCOUNTER — Ambulatory Visit (HOSPITAL_COMMUNITY)
Admission: EM | Admit: 2018-09-09 | Discharge: 2018-09-09 | Disposition: A | Discharge status: Home / Self Care | Attending: Family Medicine | Admitting: Family Medicine

## 2018-09-09 DIAGNOSIS — H6690 Otitis media, unspecified, unspecified ear: Secondary | ICD-10-CM

## 2018-09-09 MED ORDER — CETIRIZINE HCL 10 MG PO TABS: 10.0000 mg | ORAL_TABLET | Freq: Every day | ORAL | 0 refills | Status: DC

## 2018-09-09 MED ORDER — FLUTICASONE PROPIONATE 50 MCG/ACT NA SUSP: 1.0000 | Freq: Every day | NASAL | 2 refills | Status: DC

## 2018-09-09 MED ORDER — AMOXICILLIN 500 MG PO CAPS: 1000.0000 mg | ORAL_CAPSULE | Freq: Three times a day (TID) | ORAL | 0 refills | Status: AC

## 2018-09-09 NOTE — Discharge Instructions (Signed)
We are treating your for an ear infection Amoxicillin 3 x day for 7 days.  You can take ibuprofen for pain Flonase and zyrtec for congestion and drainage.  Follow up as needed for continued or worsening symptoms'

## 2018-09-09 NOTE — ED Triage Notes (Addendum)
Pt cc left ear discomfort and the right ear is tender X 3 days

## 2018-09-11 NOTE — ED Provider Notes (Signed)
[facility]    CSN: [id number] Arrival date & time: 09/09/18  1412     History   Chief Complaint Chief Complaint  Patient presents with  . Otalgia    HPI Floyde Shake Hlavaty Montez Hageman. is a 23 y.o. male.   Pt is a 23 year old male that presents with left ear pain x 3 days. This has been constant and remains the same. He has had some nasal congestion and rhinorrhea. He has not been taking anything for his symptoms. Denies any associated fever, chills, cough, sore throat.   ROS per HPI    Otalgia    History reviewed. No pertinent past medical history.  Patient Active Problem List   Diagnosis Date Noted  . Acute upper respiratory infection 07/28/2015    History reviewed. No pertinent surgical history.     Home Medications    Prior to Admission medications   Medication Sig Start Date End Date Taking? Authorizing Provider  amoxicillin (AMOXIL) 500 MG capsule Take 2 capsules (1,000 mg total) by mouth 3 (three) times daily for 7 days. 09/09/18 09/16/18  Dahlia Byes A, NP  cetirizine (ZYRTEC) 10 MG tablet Take 1 tablet (10 mg total) by mouth daily. 09/09/18   Vir Whetstine, Gloris Manchester A, NP  fluticasone (FLONASE) 50 MCG/ACT nasal spray Place 1 spray into both nostrils daily. 09/09/18   Dahlia Byes A, NP  hydrOXYzine (ATARAX/VISTARIL) 25 MG tablet Take 1 tablet (25 mg total) by mouth every 6 (six) hours. 08/26/18   Dahlia Byes A, NP  omeprazole (PRILOSEC) 20 MG capsule Take 1 capsule (20 mg total) by mouth daily. 05/11/18   Georgetta Haber, NP    Family History Family History  Problem Relation Age of Onset  . Hypertension Mother     Social History Social History   Tobacco Use  . Smoking status: Never Smoker  . Smokeless tobacco: Never Used  Substance Use Topics  . Alcohol use: No    Alcohol/week: 0.0 standard drinks  . Drug use: No     Allergies   Patient has no known allergies.   Review of Systems Review of Systems  HENT: Positive for ear pain.      Physical  Exam Triage Vital Signs ED Triage Vitals  Enc Vitals Group     BP 09/09/18 1505 132/78     Pulse Rate 09/09/18 1505 70     Resp 09/09/18 1505 18     Temp 09/09/18 1505 98.3 F (36.8 C)     Temp Source 09/09/18 1505 Oral     SpO2 09/09/18 1505 98 %     Weight --      Height --      Head Circumference --      Peak Flow --      Pain Score 09/09/18 1506 6     Pain Loc --      Pain Edu? --      Excl. in GC? --    No data found.  Updated Vital Signs BP 132/78   Pulse 70   Temp 98.3 F (36.8 C) (Oral)   Resp 18   SpO2 98%   Visual Acuity Right Eye Distance:   Left Eye Distance:   Bilateral Distance:    Right Eye Near:   Left Eye Near:    Bilateral Near:     Physical Exam Vitals signs and nursing note reviewed.  Constitutional:      General: He is not in acute distress.  Appearance: Normal appearance. He is normal weight. He is not ill-appearing, toxic-appearing or diaphoretic.  HENT:     Head: Normocephalic and atraumatic.     Right Ear: Tympanic membrane and ear canal normal.     Left Ear: Tympanic membrane is erythematous and retracted.     Nose: Congestion and rhinorrhea present.     Mouth/Throat:     Pharynx: Oropharynx is clear.  Eyes:     Conjunctiva/sclera: Conjunctivae normal.  Neck:     Musculoskeletal: Normal range of motion and neck supple.  Cardiovascular:     Rate and Rhythm: Normal rate and regular rhythm.  Pulmonary:     Effort: Pulmonary effort is normal.     Breath sounds: Normal breath sounds.  Musculoskeletal: Normal range of motion.  Skin:    General: Skin is warm and dry.  Neurological:     Mental Status: He is alert.  Psychiatric:        Mood and Affect: Mood normal.      UC Treatments / Results  Labs (all labs ordered are listed, but only abnormal results are displayed) Labs Reviewed - No data to display  EKG None  Radiology No results found.  Procedures Procedures (including critical care time)  Medications  Ordered in UC Medications - No data to display  Initial Impression / Assessment and Plan / UC Course  I have reviewed the triage vital signs and the nursing notes.  Pertinent labs & imaging results that were available during my care of the patient were reviewed by me and considered in my medical decision making (see chart for details).     Symptoms consistent with left otitis media Treated with amoxicillin 3 times a day for 7 days He can take ibuprofen for pain inflammation Flonase and Zyrtec for congestion and drainage Final Clinical Impressions(s) / UC Diagnoses   Final diagnoses:  Acute otitis media, unspecified otitis media type     Discharge Instructions     We are treating your for an ear infection Amoxicillin 3 x day for 7 days.  You can take ibuprofen for pain Flonase and zyrtec for congestion and drainage.  Follow up as needed for continued or worsening symptoms'     ED Prescriptions    Medication Sig Dispense Auth. Provider   amoxicillin (AMOXIL) 500 MG capsule Take 2 capsules (1,000 mg total) by mouth 3 (three) times daily for 7 days. 40 capsule Sieanna Vanstone A, NP   cetirizine (ZYRTEC) 10 MG tablet Take 1 tablet (10 mg total) by mouth daily. 30 tablet Inocencio Roy A, NP   fluticasone (FLONASE) 50 MCG/ACT nasal spray Place 1 spray into both nostrils daily. 16 g Dahlia Byes A, NP     Controlled Substance Prescriptions [place] Controlled Substance Registry consulted? Not Applicable   Janace Aris, NP 09/11/18 [phone]

## 2018-12-27 ENCOUNTER — Other Ambulatory Visit: Payer: Self-pay

## 2018-12-27 ENCOUNTER — Encounter (HOSPITAL_COMMUNITY): Payer: Self-pay

## 2018-12-27 ENCOUNTER — Ambulatory Visit (HOSPITAL_COMMUNITY)
Admission: EM | Admit: 2018-12-27 | Discharge: 2018-12-27 | Disposition: A | Discharge status: Home / Self Care | Payer: Self-pay | Attending: Internal Medicine | Admitting: Internal Medicine

## 2018-12-27 DIAGNOSIS — Z113 Encounter for screening for infections with a predominantly sexual mode of transmission: Secondary | ICD-10-CM | POA: Insufficient documentation

## 2018-12-27 NOTE — ED Provider Notes (Signed)
[facility]    CSN: [id number] Arrival date & time: 12/27/18  1114      History   Chief Complaint Chief Complaint  Patient presents with  . STD Testing    HPI Evan Jewndre Tyrique Gagliardo Montez HagemanJr. is a 23 y.o. male.   Evan JewAndre Tyrique [employer] presents with requests for STD screen. No known exposure, no symptoms. Sexually active with 1 partner, states "on and off." doesn't use condoms. Denies sores, lesions, redness or swelling of the genitalia. Denies dysuria, penile discharge, fevers, chills, rash, or pelvic pain. Without contributing medical history.     ROS per HPI, negative if not otherwise mentioned.      History reviewed. No pertinent past medical history.  Patient Active Problem List   Diagnosis Date Noted  . Acute upper respiratory infection 07/28/2015    History reviewed. No pertinent surgical history.     Home Medications    Prior to Admission medications   Medication Sig Start Date End Date Taking? Authorizing Provider  cetirizine (ZYRTEC) 10 MG tablet Take 1 tablet (10 mg total) by mouth daily. 09/09/18   Bast, Gloris Manchesterraci A, NP  fluticasone (FLONASE) 50 MCG/ACT nasal spray Place 1 spray into both nostrils daily. 09/09/18   Dahlia ByesBast, Traci A, NP  hydrOXYzine (ATARAX/VISTARIL) 25 MG tablet Take 1 tablet (25 mg total) by mouth every 6 (six) hours. 08/26/18   Dahlia ByesBast, Traci A, NP  omeprazole (PRILOSEC) 20 MG capsule Take 1 capsule (20 mg total) by mouth daily. 05/11/18   Georgetta HaberBurky, Treasure Ochs B, NP    Family History Family History  Problem Relation Age of Onset  . Hypertension Mother     Social History Social History   Tobacco Use  . Smoking status: Never Smoker  . Smokeless tobacco: Never Used  Substance Use Topics  . Alcohol use: No    Alcohol/week: 0.0 standard drinks  . Drug use: No     Allergies   Patient has no known allergies.   Review of Systems Review of Systems   Physical Exam Triage Vital Signs ED Triage Vitals  Enc Vitals Group     BP  12/27/18 1200 122/78     Pulse Rate 12/27/18 1200 94     Resp 12/27/18 1200 18     Temp 12/27/18 1200 99.1 F (37.3 C)     Temp Source 12/27/18 1200 Oral     SpO2 12/27/18 1200 98 %     Weight --      Height --      Head Circumference --      Peak Flow --      Pain Score 12/27/18 1202 0     Pain Loc --      Pain Edu? --      Excl. in GC? --    No data found.  Updated Vital Signs BP 122/78 (BP Location: Left Arm)   Pulse 94   Temp 99.1 F (37.3 C) (Oral)   Resp 18   SpO2 98%    Physical Exam Constitutional:      Appearance: He is well-developed.  Cardiovascular:     Rate and Rhythm: Normal rate and regular rhythm.  Pulmonary:     Effort: Pulmonary effort is normal.     Breath sounds: Normal breath sounds.  Abdominal:     Palpations: Abdomen is soft. Abdomen is not rigid.     Tenderness: There is no abdominal tenderness. There is no guarding or rebound. Negative signs include Murphy's sign and McBurney's  sign.     Comments: Denies scrotal redness, swelling, pain; denies sores or lesions; gu exam deferred   Skin:    General: Skin is warm and dry.  Neurological:     Mental Status: He is alert and oriented to person, place, and time.      UC Treatments / Results  Labs (all labs ordered are listed, but only abnormal results are displayed) Labs Reviewed  HIV ANTIBODY (ROUTINE TESTING W REFLEX)  RPR  URINE CYTOLOGY ANCILLARY ONLY    EKG None  Radiology No results found.  Procedures Procedures (including critical care time)  Medications Ordered in UC Medications - No data to display  Initial Impression / Assessment and Plan / UC Course  I have reviewed the triage vital signs and the nursing notes.  Pertinent labs & imaging results that were available during my care of the patient were reviewed by me and considered in my medical decision making (see chart for details).     Screening pending. Will notify of any positive findings and if any changes to  treatment are needed.  Encouraged safe sex practices. Patient verbalized understanding and agreeable to plan.   Final Clinical Impressions(s) / UC Diagnoses   Final diagnoses:  Screen for STD (sexually transmitted disease)     Discharge Instructions     Please withhold from intercourse until test results are back.  We recommend condom use to prevent spread of STD.  Will notify you of any positive findings and if any changes to treatment are needed.   You may monitor your results on your MyChart online as well.     ED Prescriptions    None     Controlled Substance Prescriptions [place] Controlled Substance Registry consulted? Not Applicable   Zigmund Gottron, NP 12/27/18 1411

## 2018-12-27 NOTE — Discharge Instructions (Signed)
Please withhold from intercourse until test results are back.  We recommend condom use to prevent spread of STD.  Will notify you of any positive findings and if any changes to treatment are needed.   You may monitor your results on your MyChart online as well.

## 2018-12-27 NOTE — ED Triage Notes (Signed)
Pt presents for STD testing pt denies any symptoms  ?

## 2018-12-28 LAB — RPR: RPR Ser Ql: NONREACTIVE

## 2018-12-28 LAB — HIV ANTIBODY (ROUTINE TESTING W REFLEX): HIV Screen 4th Generation wRfx: NONREACTIVE

## 2018-12-30 LAB — URINE CYTOLOGY ANCILLARY ONLY
Chlamydia: NEGATIVE
Neisseria Gonorrhea: NEGATIVE
Trichomonas: NEGATIVE

## 2019-08-18 ENCOUNTER — Encounter (HOSPITAL_COMMUNITY): Payer: Self-pay

## 2019-08-18 ENCOUNTER — Ambulatory Visit (HOSPITAL_COMMUNITY)
Admission: EM | Admit: 2019-08-18 | Discharge: 2019-08-18 | Disposition: A | Discharge status: Home / Self Care | Payer: Medicaid Other | Attending: Physician Assistant | Admitting: Physician Assistant

## 2019-08-18 ENCOUNTER — Other Ambulatory Visit: Payer: Self-pay

## 2019-08-18 DIAGNOSIS — K5900 Constipation, unspecified: Secondary | ICD-10-CM

## 2019-08-18 DIAGNOSIS — E860 Dehydration: Secondary | ICD-10-CM

## 2019-08-18 LAB — POCT URINALYSIS DIP (DEVICE)
Bilirubin Urine: NEGATIVE
Glucose, UA: NEGATIVE mg/dL
Hgb urine dipstick: NEGATIVE
Ketones, ur: NEGATIVE mg/dL
Leukocytes,Ua: NEGATIVE
Nitrite: NEGATIVE
Protein, ur: 30 mg/dL — AB
Specific Gravity, Urine: >=1.03 (ref 1.005–1.030)
Urobilinogen, UA: 0.2 mg/dL (ref 0.0–1.0)
pH: 7 (ref 5.0–8.0)

## 2019-08-18 NOTE — Discharge Instructions (Addendum)
Your urine shows that you may be dehydrated and considering issues with constipation, this makes sense. - increase your water intake - increase fiber intake  If symptoms persist, follow up with your primary care provider at your next visit.

## 2019-08-18 NOTE — ED Provider Notes (Signed)
[facility]    CSN: [id number] Arrival date & time: 08/18/19  1633      History   Chief Complaint Chief Complaint  Patient presents with  . Urinary Tract Infection    HPI Evan Jew Smedberg Montez Hageman. is a 24 y.o. male.   Patient reports to urgent care today for concern of dark colored urine and cloudy urine. He reports these symptoms occurred about 1 month ago. He denies painful urination, penile discharge. He states on and off dark yellow urine, occasional cloudiness and strong odor at times. He denies fever and chills. He is concerned if this is a urinary tract infection.  He also reports concern about having bowel movements once every 2-3 days. He reports on occasion these are hard and strenuous to pass. He denies dark color or blood in stool.   He drinks ample water, is active, trying to eat more fiber and takes multi-vitamins. Denies specific supplement use.     History reviewed. No pertinent past medical history.  Patient Active Problem List   Diagnosis Date Noted  . Acute upper respiratory infection 07/28/2015    History reviewed. No pertinent surgical history.     Home Medications    Prior to Admission medications   Medication Sig Start Date End Date Taking? Authorizing Provider  cetirizine (ZYRTEC) 10 MG tablet Take 1 tablet (10 mg total) by mouth daily. 09/09/18   Bast, Gloris [place] A, NP  fluticasone (FLONASE) 50 MCG/ACT nasal spray Place 1 spray into both nostrils daily. 09/09/18   Dahlia Byes A, NP  hydrOXYzine (ATARAX/VISTARIL) 25 MG tablet Take 1 tablet (25 mg total) by mouth every 6 (six) hours. 08/26/18   Dahlia Byes A, NP  omeprazole (PRILOSEC) 20 MG capsule Take 1 capsule (20 mg total) by mouth daily. 05/11/18   Georgetta Haber, NP    Family History Family History  Problem Relation Age of Onset  . Hypertension Mother     Social History Social History   Tobacco Use  . Smoking status: Never Smoker  . Smokeless tobacco: Never Used  Substance  Use Topics  . Alcohol use: Yes    Alcohol/week: 0.0 standard drinks    Comment: socially  . Drug use: No     Allergies   Patient has no known allergies.   Review of Systems Review of Systems  Constitutional: Negative for chills and fever.  Eyes: Negative for pain and visual disturbance.  Respiratory: Negative for cough and shortness of breath.   Cardiovascular: Negative for chest pain and palpitations.  Gastrointestinal: Positive for constipation. Negative for abdominal pain, diarrhea, nausea, rectal pain and vomiting.  Genitourinary: Negative for discharge, dysuria, frequency, hematuria and urgency.  Musculoskeletal: Negative for arthralgias and back pain.  Skin: Negative for color change and rash.  Neurological: Negative for seizures and syncope.  All other systems reviewed and are negative.    Physical Exam Triage Vital Signs ED Triage Vitals  Enc Vitals Group     BP 08/18/19 1750 (!) 146/88     Pulse Rate 08/18/19 1750 73     Resp 08/18/19 1750 16     Temp 08/18/19 1750 97.7 F (36.5 C)     Temp Source 08/18/19 1750 Oral     SpO2 08/18/19 1750 100 %     Weight --      Height --      Head Circumference --      Peak Flow --      Pain Score 08/18/19 1749 0  Pain Loc --      Pain Edu? --      Excl. in GC? --    No data found.  Updated Vital Signs BP (!) 146/88 (BP Location: Left Arm)   Pulse 73   Temp 97.7 F (36.5 C) (Oral)   Resp 16   SpO2 100%   Visual Acuity Right Eye Distance:   Left Eye Distance:   Bilateral Distance:    Right Eye Near:   Left Eye Near:    Bilateral Near:     Physical Exam Vitals and nursing note reviewed.  Constitutional:      General: He is not in acute distress.    Appearance: Normal appearance. He is well-developed. He is not ill-appearing.  HENT:     Head: Normocephalic and atraumatic.  Eyes:     General: No scleral icterus.    Conjunctiva/sclera: Conjunctivae normal.     Pupils: Pupils are equal, round, and  reactive to light.  Cardiovascular:     Rate and Rhythm: Normal rate and regular rhythm.     Heart sounds: No murmur.  Pulmonary:     Effort: Pulmonary effort is normal. No respiratory distress.     Breath sounds: Normal breath sounds.  Abdominal:     Palpations: Abdomen is soft.     Tenderness: There is no abdominal tenderness.  Musculoskeletal:     Cervical back: Neck supple.     Right lower leg: No edema.     Left lower leg: No edema.  Skin:    General: Skin is warm and dry.  Neurological:     General: No focal deficit present.     Mental Status: He is alert and oriented to person, place, and time.  Psychiatric:        Mood and Affect: Mood normal.        Behavior: Behavior normal.        Thought Content: Thought content normal.        Judgment: Judgment normal.      UC Treatments / Results  Labs (all labs ordered are listed, but only abnormal results are displayed) Labs Reviewed  POCT URINALYSIS DIP (DEVICE) - Abnormal; Notable for the following components:      Result Value   Protein, ur 30 (*)    All other components within normal limits    EKG   Radiology No results found.  Procedures Procedures (including critical care time)  Medications Ordered in UC Medications - No data to display  Initial Impression / Assessment and Plan / UC Course  I have reviewed the triage vital signs and the nursing notes.  Pertinent labs & imaging results that were available during my care of the patient were reviewed by me and considered in my medical decision making (see chart for details).     #Dehydration #constipation - UA with high spec grav, otherwise ok. Seems dehydration. Recommend increased PO fluids and fiber intake. Follow up with PCP if not improving at next visit.   Final Clinical Impressions(s) / UC Diagnoses   Final diagnoses:  Dehydration  Constipation, unspecified constipation type     Discharge Instructions     Your urine shows that you may  be dehydrated and considering issues with constipation, this makes sense. - increase your water intake - increase fiber intake  If symptoms persist, follow up with your primary care provider at your next visit.      ED Prescriptions    None  PDMP not reviewed this encounter.   Purnell Shoemaker, PA-C 08/18/19 [place]

## 2019-08-18 NOTE — ED Triage Notes (Signed)
Patient presents to Urgent Care with complaints of burning w/ urination since a few weeks ago. Patient reports he has not had the sx recently, but is worried he may still have it.

## 2019-09-08 HISTORY — PX: NO PAST SURGERIES: SHX2092

## 2019-09-09 ENCOUNTER — Ambulatory Visit: Payer: BC Managed Care – PPO | Admitting: Medical

## 2019-09-09 ENCOUNTER — Other Ambulatory Visit: Payer: Self-pay

## 2019-09-09 ENCOUNTER — Encounter: Payer: Self-pay | Admitting: Medical

## 2019-09-09 VITALS — BP 126/84 | HR 78 | Temp 97.7°F | Ht 72.5 in | Wt 205.4 lb

## 2019-09-09 DIAGNOSIS — R300 Dysuria: Secondary | ICD-10-CM | POA: Diagnosis not present

## 2019-09-09 DIAGNOSIS — Z0000 Encounter for general adult medical examination without abnormal findings: Secondary | ICD-10-CM

## 2019-09-09 DIAGNOSIS — Z1271 Encounter for screening for malignant neoplasm of testis: Secondary | ICD-10-CM | POA: Diagnosis not present

## 2019-09-09 DIAGNOSIS — Z13220 Encounter for screening for lipoid disorders: Secondary | ICD-10-CM | POA: Insufficient documentation

## 2019-09-09 DIAGNOSIS — Z113 Encounter for screening for infections with a predominantly sexual mode of transmission: Secondary | ICD-10-CM

## 2019-09-09 DIAGNOSIS — Z130 Encounter for screening for diseases of the blood and blood-forming organs and certain disorders involving the immune mechanism: Secondary | ICD-10-CM

## 2019-09-09 LAB — POCT URINALYSIS DIP (PROADVANTAGE DEVICE)
Bilirubin, UA: NEGATIVE
Blood, UA: NEGATIVE
Glucose, UA: NEGATIVE mg/dL
Ketones, POC UA: NEGATIVE mg/dL
Leukocytes, UA: NEGATIVE
Nitrite, UA: NEGATIVE
Protein Ur, POC: NEGATIVE mg/dL
Specific Gravity, Urine: 1.015
Urobilinogen, Ur: NEGATIVE
pH, UA: 6 (ref 5.0–8.0)

## 2019-09-09 NOTE — Progress Notes (Signed)
Subjective:   HPI  Evan Turner. is a 24 y.o. male who presents for Chief Complaint  Patient presents with  . New Patient (Initial Visit)    establish care   . Annual Exam    Patient Care Team: Oluwadamilola Deliz, Cleda Mccreedy as PCP - General (Family Medicine) Sees dentist Sees eye doctor  Concerns: Wants sickle cell screen.  No family members with sickle cell but wants to screen for himself.  He has first child on the way.  Has had some cloudy or discolored urine at times.  Wants this checked.   No concern for STD,  Has girlfriend, no recent new partners.    No other c/o.   Reviewed their medical, surgical, family, social, medication, and allergy history and updated chart as appropriate.  History reviewed. No pertinent past medical history.  Past Surgical History:  Procedure Laterality Date  . NO PAST SURGERIES  09/2019    Social History   Socioeconomic History  . Marital status: Single    Spouse name: Not on file  . Number of children: Not on file  . Years of education: Not on file  . Highest education level: Not on file  Occupational History  . Not on file  Tobacco Use  . Smoking status: Never Smoker  . Smokeless tobacco: Never Used  Substance and Sexual Activity  . Alcohol use: Yes    Alcohol/week: 4.0 standard drinks    Types: 4 Standard drinks or equivalent per week    Comment: socially  . Drug use: No  . Sexual activity: Yes  Other Topics Concern  . Not on file  Social History Narrative   Has girlfriend, is expected 1st child in 02/2020, from [place] originally, in school [employer] for [employer] and criminal justice [place], plans to move to [place], [place].  09/2019   Social Determinants of Health   Financial Resource Strain:   . Difficulty of Paying Living Expenses: Not on file  Food Insecurity:   . Worried About Programme researcher, broadcasting/film/video in the Last Year: Not on file  . Ran Out of Food in the Last Year: Not on file  Transportation Needs:   .  Lack of Transportation (Medical): Not on file  . Lack of Transportation (Non-Medical): Not on file  Physical Activity:   . Days of Exercise per Week: Not on file  . Minutes of Exercise per Session: Not on file  Stress:   . Feeling of Stress : Not on file  Social Connections:   . Frequency of Communication with Friends and Family: Not on file  . Frequency of Social Gatherings with Friends and Family: Not on file  . Attends Religious Services: Not on file  . Active Member of Clubs or Organizations: Not on file  . Attends Banker Meetings: Not on file  . Marital Status: Not on file  Intimate Partner Violence:   . Fear of Current or Ex-Partner: Not on file  . Emotionally Abused: Not on file  . Physically Abused: Not on file  . Sexually Abused: Not on file    Family History  Problem Relation Age of Onset  . Hypertension Mother   . Diabetes Maternal Grandmother   . Cancer Neg Hx   . Heart disease Neg Hx   . Vision loss Neg Hx      Current Outpatient Medications:  .  cetirizine (ZYRTEC) 10 MG tablet, Take 1 tablet (10 mg total) by mouth daily. (Patient not taking:  Reported on 09/09/2019), Disp: 30 tablet, Rfl: 0 .  fluticasone (FLONASE) 50 MCG/ACT nasal spray, Place 1 spray into both nostrils daily. (Patient not taking: Reported on 09/09/2019), Disp: 16 g, Rfl: 2 .  hydrOXYzine (ATARAX/VISTARIL) 25 MG tablet, Take 1 tablet (25 mg total) by mouth every 6 (six) hours. (Patient not taking: Reported on 09/09/2019), Disp: 12 tablet, Rfl: 0 .  omeprazole (PRILOSEC) 20 MG capsule, Take 1 capsule (20 mg total) by mouth daily. (Patient not taking: Reported on 09/09/2019), Disp: 30 capsule, Rfl: 0  No Known Allergies     Review of Systems Constitutional: -fever, -chills, -sweats, -unexpected weight change, -decreased appetite, -fatigue Allergy: -sneezing, -itching, -congestion Dermatology: -changing moles, --rash, -lumps ENT: -runny nose, -ear pain, -sore throat, -hoarseness, -sinus  pain, -teeth pain, - ringing in ears, -hearing loss, -nosebleeds Cardiology: -chest pain, -palpitations, -swelling, -difficulty breathing when lying flat, -waking up short of breath Respiratory: -cough, -shortness of breath, -difficulty breathing with exercise or exertion, -wheezing, -coughing up blood Gastroenterology: -abdominal pain, -nausea, -vomiting, -diarrhea, -constipation, -blood in stool, -changes in bowel movement, -difficulty swallowing or eating Hematology: -bleeding, -bruising  Musculoskeletal: -joint aches, -muscle aches, -joint swelling, -back pain, -neck pain, -cramping, -changes in gait Ophthalmology: denies vision changes, eye redness, itching, discharge Urology: -burning with urination, -difficulty urinating, -blood in urine, -urinary frequency, -urgency, -incontinence Neurology: -headache, -weakness, -tingling, -numbness, -memory loss, -falls, -dizziness Psychology: -depressed mood, -agitation, -sleep problems Male GU: no testicular mass, pain, no lymph nodes swollen, no swelling, no rash.      Objective:  BP 126/84   Pulse (!) 110   Temp 97.7 F (36.5 C)   Ht 6' 0.5" (1.842 m)   Wt 205 lb 6.4 oz (93.2 kg)   SpO2 100%   BMI 27.47 kg/m   General appearance: alert, no distress, WD/WN, African American male, muscular Skin: right upper arm tattoo, mild facial acne, no worrisome findings HEENT: normocephalic, conjunctiva/corneas normal, sclerae anicteric, PERRLA, EOMi, nares patent, no discharge or erythema, pharynx normal Oral cavity: MMM, tongue normal, teeth normal Neck: supple, no lymphadenopathy, no thyromegaly, no masses, normal ROM, no bruits Chest: non tender, normal shape and expansion Heart: RRR, normal S1, S2, no murmurs Lungs: CTA bilaterally, no wheezes, rhonchi, or rales Abdomen: +bs, soft, non tender, non distended, no masses, no hepatomegaly, no splenomegaly, no bruits Back: non tender, normal ROM, no scoliosis Musculoskeletal: upper extremities non  tender, no obvious deformity, normal ROM throughout, lower extremities non tender, no obvious deformity, normal ROM throughout Extremities: no edema, no cyanosis, no clubbing Pulses: 2+ symmetric, upper and lower extremities, normal cap refill Neurological: alert, oriented x 3, CN2-12 intact, strength normal upper extremities and lower extremities, sensation normal throughout, DTRs 2+ throughout, no cerebellar signs, gait normal Psychiatric: normal affect, behavior normal, pleasant  GU: normal male external genitalia,circumcised, nontender, no masses, no hernia, no lymphadenopathy Rectal: deferred   Assessment and Plan :   Encounter Diagnoses  Name Primary?  . Encounter for health maintenance examination in adult Yes  . Screening for testicular cancer   . Screen for STD (sexually transmitted disease)   . Dysuria   . Screening for sickle-cell disease or trait   . Screening for lipid disorders     Physical exam - discussed and counseled on healthy lifestyle, diet, exercise, preventative care, vaccinations, sick and well care, proper use of emergency dept and after hours care, and addressed their concerns.    Health screening: See your eye doctor yearly for routine vision care. See your  dentist yearly for routine dental care including hygiene visits twice yearly.  Discussed STD testing, discussed prevention, condom use, means of transmission  Cancer screening Advised monthly self testicular exam   Vaccinations: Advised yearly influenza vaccine He notes recent tetanus booster within last few years    Separate significant chronic issues discussed: Dysuria - f/u pending labs    Evan Turner was seen today for new patient (initial visit) and annual exam.  Diagnoses and all orders for this visit:  Encounter for health maintenance examination in adult -     Comprehensive metabolic panel -     CBC with Differential/Platelet -     Lipid panel -     GC/Chlamydia Probe Amp -     POCT  Urinalysis DIP (Proadvantage Device) -     TSH -     Sickle cell screen  Screening for testicular cancer  Screen for STD (sexually transmitted disease)  Dysuria -     GC/Chlamydia Probe Amp -     POCT Urinalysis DIP (Proadvantage Device)  Screening for sickle-cell disease or trait -     Sickle cell screen  Screening for lipid disorders -     Lipid panel    Follow-up pending labs, yearly for physical

## 2019-09-09 NOTE — Patient Instructions (Signed)
Preventative Care for Adults - Male    Thank you for coming in for your well visit today, and thank you for trusting [place] with your care!   Maintain regular health and wellness exams:  A routine yearly physical is a good way to check in with your primary care provider about your health and preventive screening. It is also an opportunity to share updates about your health and any concerns you have, and receive a thorough all-over exam.   Most health insurance companies pay for at least some preventative services.  Check with your health plan for specific coverages.  What preventative services do men need?  Adult men should have their weight and blood pressure checked regularly.   Men age 60 and older should have their cholesterol levels checked regularly.  Beginning at age 53 and continuing to age 53, men should be screened for colorectal cancer.  Certain people may need continued testing until age 71.  Updating vaccinations is part of preventative care.  Vaccinations help protect against diseases such as the flu.  Osteoporosis is a disease in which the bones lose minerals and strength as we age. Men ages 43 and over should discuss this with their caregivers  Lab tests are generally done as part of preventative care to screen for anemia and blood disorders, to screen for problems with the kidneys and liver, to screen for bladder problems, to check blood sugar, and to check your cholesterol level.  Preventative services generally include counseling about diet, exercise, avoiding tobacco, drugs, excessive alcohol consumption, and sexually transmitted infections.   Xrays and CT scans are not normally done as a preventative test, and most insurances do not pay for imaging for screening other than as discussed under cancer screens below.   On the other hand, if you have certain medical concerns, imaging may be necessary as a diagnostic test.    Your Medical Team Your medical team starts with  [place], your PCP or primary care provider.  Please use our services for your routine care such as physicals, screenings, immunizations, sick visits, and your first stop for general medical concerns.  You can call our number for after hours information for urgent questions that may need attention but cannot wait til the next business day.    Urgent care-urgent cares exist to provide care when your primary care office would typically be closed such as evenings or weekends.   Urgent care is for evaluation of urgent medical problems that do not necessarily require emergency department care, but cannot wait til the next business day when we are open.  Emergency department care-please reserve emergency department care for serious, urgent, possibly life-threatening medical problems.  This includes issues like possible stroke, heart attack, significant injury, mental health crisis, or other urgent need that requires immediate medical attention.     See your dentist office twice yearly for hygiene and cleaning visits.   Brush your teeth and floss your teeth daily.  See your eye doctor yearly for routine eye exam and screenings for glaucoma and retinal disease.   Specific Concerns: We will call with labs and urine results   Vaccines:  Stay up to date with your tetanus shots and other required immunizations. You should have a booster for tetanus every 10 years. Be sure to get your flu shot every year, since 5%-20% of the [place] population comes down with the flu. The flu vaccine changes each year, so being vaccinated once is not enough. Get your shot in the  fall, before the flu season peaks.   Other vaccines to consider:  Pneumococcal vaccine to protect against certain types of pneumonia.  This is normally recommended for adults age 40 or older.  However, adults younger than 24 years old with certain underlying conditions such as diabetes, heart or lung disease should also receive the vaccine.  Shingles vaccine  to protect against Varicella Zoster if you are older than age 75, or younger than 24 years old with certain underlying illness.  If you have not had the Shingrix vaccine, please call your insurer to inquire about coverage for the Shingrix vaccine given in 2 doses.   Some insurers cover this vaccine after age 27, some cover this after age 88.  If your insurer covers this, then call to schedule appointment to have this vaccine here  Hepatitis A vaccine to protect against a form of infection of the liver by a virus acquired from food.  Hepatitis B vaccine to protect against a form of infection of the liver by a virus acquired from blood or body fluids, particularly if you work in health care.  If you plan to travel internationally, check with your local health department for specific vaccination recommendations.  Human Papilloma Virus or HPV causes cancer of the cervix, and other infections that can be transmitted from person to person. There is a vaccine for HPV, and males should get immunized between the ages of 69 and 62. It requires a series of 3 shots.   Covid/Coronavirus - as the vaccines are becoming available, please consider vaccination if you are a health care worker, first responder, or have significant health problems such as asthma, COPD, heart disease, hypertension, diabetes, obesity, multiple medical problems, over age 2yo, or immunocompromised.        What should I know about Cancer screening? Many types of cancers can be detected early and may often be prevented. Lung Cancer  You should be screened every year for lung cancer if: ? You are a current smoker who has smoked for at least 30 years. ? You are a former smoker who has quit within the past 15 years.  Talk to your health care provider about your screening options, when you should start screening, and how often you should be screened.  Colorectal Cancer  Routine colorectal cancer screening usually begins at 24 years of  age and should be repeated every 5-10 years until you are 24 years old. You may need to be screened more often if early forms of precancerous polyps or small growths are found. Your health care provider may recommend screening at an earlier age if you have risk factors for colon cancer.  Your health care provider may recommend using home test kits to check for hidden blood in the stool.  A small camera at the end of a tube can be used to examine your colon (sigmoidoscopy or colonoscopy). This checks for the earliest forms of colorectal cancer.  Prostate and Testicular Cancer  Depending on your age and overall health, your health care provider may do certain tests to screen for prostate and testicular cancer.  Talk to your health care provider about any symptoms or concerns you have about testicular or prostate cancer.  Skin Cancer  Check your skin from head to toe regularly.  Tell your health care provider about any new moles or changes in moles, especially if: ? There is a change in a mole's size, shape, or color. ? You have a mole that is larger than  a pencil eraser.  Always use sunscreen. Apply sunscreen liberally and repeat throughout the day.  Protect yourself by wearing long sleeves, pants, a wide-brimmed hat, and sunglasses when outside.   Are you up to date on cancer screenings:  COLON CANCER SCREENING:  Age > 45  PROSTATE CANCER SCREENING: age >59  TESTICULAR CANCER SCREENING:  check your self monthly at home     GENERAL RECOMMENDATIONS FOR GOOD HEALTH:  Healthy diet:  Eat a variety of foods, including fruit, vegetables, animal or vegetable protein, such as meat, fish, chicken, and eggs, or beans, lentils, tofu, and grains, such as rice.  Drink plenty of water daily.  Decrease saturated fat in the diet, avoid lots of red meat, processed foods, sweets, fast foods, and fried foods.  Exercise:  Aerobic exercise helps maintain good heart health. At least 30-40 minutes  of moderate-intensity exercise is recommended. For example, a brisk walk that increases your heart rate and breathing. This should be done on most days of the week.   Find a type of exercise or a variety of exercises that you enjoy so that it becomes a part of your daily life.  Examples are running, walking, swimming, water aerobics, and biking.  For motivation and support, explore group exercise such as aerobic class, spin class, Zumba, Yoga,or  martial arts, etc.    Set exercise goals for yourself, such as a certain weight goal, walk or run in a race such as a 5k walk/run.  Speak to your primary care provider about exercise goals.  Your weight readings per our records: Wt Readings from Last 3 Encounters:  09/09/19 205 lb 6.4 oz (93.2 kg)  08/18/19 202 lb 9.6 oz (91.9 kg)  06/13/18 205 lb (93 kg)    Body mass index is 27.47 kg/m.    Disease prevention:  If you smoke or chew tobacco, find out from your caregiver how to quit. It can literally save your life, no matter how long you have been a tobacco user. If you do not use tobacco, never begin.   Maintain a healthy diet and normal weight. Increased weight leads to problems with blood pressure and diabetes.   The Body Mass Index or BMI is a way of measuring how much of your body is fat. Having a BMI above 27 increases the risk of heart disease, diabetes, hypertension, stroke and other problems related to obesity. Your caregiver can help determine your BMI and based on it develop an exercise and dietary program to help you achieve or maintain this important measurement at a healthful level.  High blood pressure causes heart and blood vessel problems.  Persistent high blood pressure should be treated with medicine if weight loss and exercise do not work.  Your blood pressure readings per our records:     BP Readings from Last 3 Encounters:  09/09/19 126/84  08/18/19 (!) 146/88  12/27/18 122/78     Fat and cholesterol leaves deposits  in your arteries that can block them. This causes heart disease and vessel disease elsewhere in your body.  If your cholesterol is found to be high, or if you have heart disease or certain other medical conditions, then you may need to have your cholesterol monitored frequently and be treated with medication.   Ask if you should have a cardiac stress test if your history suggests this. A stress test is a test done on a treadmill that looks for heart disease. This test can find disease prior to there being  a problem.   Osteoporosis is a disease in which the bones lose minerals and strength as we age. This can result in serious bone fractures. Risk of osteoporosis can be identified using a bone density scan. Men ages 6 and over should discuss this with their caregivers. Ask your caregiver whether you should be taking a calcium supplement and Vitamin D, to reduce the rate of osteoporosis.   Avoid drinking alcohol in excess (more than two drinks per day).  Avoid use of street drugs. Do not share needles with anyone. Ask for professional help if you need assistance or instructions on stopping the use of alcohol, cigarettes, and/or drugs.  Brush your teeth twice a day with fluoride toothpaste, and floss once a day. Good oral hygiene prevents tooth decay and gum disease. The problems can be painful, unattractive, and can cause other health problems. Visit your dentist for a routine oral and dental check up and preventive care every 6-12 months.   Safety:  Use seatbelts 100% of the time, whether driving or as a passenger.  Use safety devices such as hearing protection if you work in environments with loud noise or significant background noise.  Use safety glasses when doing any work that could send debris in to the eyes.  Use a helmet if you ride a bike or motorcycle.  Use appropriate safety gear for contact sports.  Talk to your caregiver about gun safety.  Use sunscreen with a SPF (or skin protection  factor) of 15 or greater.  Lighter skinned people are at a greater risk of skin cancer. Don't forget to also wear sunglasses in order to protect your eyes from too much damaging sunlight. Damaging sunlight can accelerate cataract formation.   Keep carbon monoxide and smoke detectors in your home functioning at all times. Change the batteries every 6 months or use a model that plugs into the wall.    Sexual activity: . Sex is a normal part of life and sexual activity can continue into older adulthood for many healthy people.   . If you are having erectile dysfunction issues, please follow up to discuss this further.   . If you are not in a monogamous relationship or have more than one partner, please practice safe sex.  Use condoms. Condoms are used for birth control and to help reduce the spread of sexually transmitted infections (or STIs).  Some of the STIs are gonorrhea (the clap), chlamydia, syphilis, trichomonas, herpes, HPV (human papilloma virus) and HIV (human immunodeficiency virus) which causes AIDS. The herpes, HIV and HPV are viral illnesses that have no cure. These can result in disability, cancer and death.   We are able to test for STIs here at our office.

## 2019-09-10 LAB — CBC WITH DIFFERENTIAL/PLATELET
Basophils Absolute: 0 10*3/uL (ref 0.0–0.2)
Basos: 1 %
EOS (ABSOLUTE): 0.2 10*3/uL (ref 0.0–0.4)
Eos: 6 %
Hematocrit: 44.6 % (ref 37.5–51.0)
Hemoglobin: 14.9 g/dL (ref 13.0–17.7)
Immature Grans (Abs): 0 10*3/uL (ref 0.0–0.1)
Immature Granulocytes: 0 %
Lymphocytes Absolute: 1.4 10*3/uL (ref 0.7–3.1)
Lymphs: 44 %
MCH: 27.6 pg (ref 26.6–33.0)
MCHC: 33.4 g/dL (ref 31.5–35.7)
MCV: 83 fL (ref 79–97)
Monocytes Absolute: 0.3 10*3/uL (ref 0.1–0.9)
Monocytes: 11 %
Neutrophils Absolute: 1.2 10*3/uL — ABNORMAL LOW (ref 1.4–7.0)
Neutrophils: 38 %
Platelets: 203 10*3/uL (ref 150–450)
RBC: 5.39 x10E6/uL (ref 4.14–5.80)
RDW: 13.3 % (ref 11.6–15.4)
WBC: 3.1 10*3/uL — ABNORMAL LOW (ref 3.4–10.8)

## 2019-09-10 LAB — COMPREHENSIVE METABOLIC PANEL
ALT: 14 IU/L (ref 0–44)
AST: 17 IU/L (ref 0–40)
Albumin/Globulin Ratio: 1.8 (ref 1.2–2.2)
Albumin: 4.7 g/dL (ref 4.1–5.2)
Alkaline Phosphatase: 58 IU/L (ref 39–117)
BUN/Creatinine Ratio: 8 — ABNORMAL LOW (ref 9–20)
BUN: 9 mg/dL (ref 6–20)
Bilirubin Total: 0.4 mg/dL (ref 0.0–1.2)
CO2: 25 mmol/L (ref 20–29)
Calcium: 9.8 mg/dL (ref 8.7–10.2)
Chloride: 100 mmol/L (ref 96–106)
Creatinine, Ser: 1.14 mg/dL (ref 0.76–1.27)
GFR calc Af Amer: 103 mL/min/{1.73_m2} (ref >59)
GFR calc non Af Amer: 90 mL/min/{1.73_m2} (ref >59)
Globulin, Total: 2.6 g/dL (ref 1.5–4.5)
Glucose: 88 mg/dL (ref 65–99)
Potassium: 4.3 mmol/L (ref 3.5–5.2)
Sodium: 137 mmol/L (ref 134–144)
Total Protein: 7.3 g/dL (ref 6.0–8.5)

## 2019-09-10 LAB — LIPID PANEL
Chol/HDL Ratio: 3.2 ratio (ref 0.0–5.0)
Cholesterol, Total: 215 mg/dL — ABNORMAL HIGH (ref 100–199)
HDL: 67 mg/dL (ref >39)
LDL Chol Calc (NIH): 123 mg/dL — ABNORMAL HIGH (ref 0–99)
Triglycerides: 145 mg/dL (ref 0–149)
VLDL Cholesterol Cal: 25 mg/dL (ref 5–40)

## 2019-09-10 LAB — GC/CHLAMYDIA PROBE AMP
Chlamydia trachomatis, NAA: NEGATIVE
Neisseria Gonorrhoeae by PCR: NEGATIVE

## 2019-09-10 LAB — SICKLE CELL SCREEN: Sickle Cell Screen: NEGATIVE

## 2019-09-10 LAB — TSH: TSH: 5.47 u[IU]/mL — ABNORMAL HIGH (ref 0.450–4.500)

## 2019-09-11 ENCOUNTER — Other Ambulatory Visit: Payer: Self-pay | Admitting: Medical

## 2019-09-11 DIAGNOSIS — R7989 Other specified abnormal findings of blood chemistry: Secondary | ICD-10-CM

## 2019-10-02 LAB — SPECIMEN STATUS REPORT

## 2019-10-02 LAB — T4, FREE: Free T4: 1.25 ng/dL (ref 0.82–1.77)

## 2019-10-03 ENCOUNTER — Ambulatory Visit (HOSPITAL_COMMUNITY)
Admission: EM | Admit: 2019-10-03 | Discharge: 2019-10-03 | Disposition: A | Discharge status: Home / Self Care | Payer: BC Managed Care – PPO

## 2019-10-03 ENCOUNTER — Other Ambulatory Visit: Payer: Self-pay

## 2019-10-03 ENCOUNTER — Encounter (HOSPITAL_COMMUNITY): Payer: Self-pay

## 2019-10-03 DIAGNOSIS — L299 Pruritus, unspecified: Secondary | ICD-10-CM

## 2019-10-03 DIAGNOSIS — N489 Disorder of penis, unspecified: Secondary | ICD-10-CM

## 2019-10-03 NOTE — ED Triage Notes (Signed)
Patient complains of itching and a "small rash" at the base of his penis. Denies need for STI testing.

## 2019-10-03 NOTE — ED Provider Notes (Signed)
[place]   MRN: [id number] DOB: 05-Feb-1996  Subjective:   Evan Turner. is a 23 y.o. male presenting for an episode of itching over left shaft of penis.  This episode occurred a few days ago and lasted for a few hours.  It resolved on its own.  Denies dysuria, hematuria, urinary frequency, penile discharge, penile swelling, testicular pain, testicular swelling, anal pain, groin pain.  Patient gets regular STI testing, currently has only one partner.  Last testing was a few months ago and was completely negative.  He is not interested in this today.  Denies using any kind of new lotion or products over genital area.  Denies having any painless or.  Denies history of genital herpes.   No current facility-administered medications for this encounter. No current outpatient medications on file.   No Known Allergies  History reviewed. No pertinent past medical history.   Past Surgical History:  Procedure Laterality Date  . NO PAST SURGERIES  09/2019    Family History  Problem Relation Age of Onset  . Hypertension Mother   . Diabetes Maternal Grandmother   . Cancer Neg Hx   . Heart disease Neg Hx   . Vision loss Neg Hx     Social History   Tobacco Use  . Smoking status: Never Smoker  . Smokeless tobacco: Never Used  Substance Use Topics  . Alcohol use: Yes    Alcohol/week: 4.0 standard drinks    Types: 4 Standard drinks or equivalent per week    Comment: socially  . Drug use: No    ROS   Objective:   Vitals: BP (!) 143/71 (BP Location: Right Arm)   Pulse 96   Temp 98.2 F (36.8 C) (Oral)   Resp 16   Wt 205 lb (93 kg)   SpO2 100%   BMI 27.42 kg/m   Physical Exam Constitutional:      General: He is not in acute distress.    Appearance: Normal appearance. He is well-developed and normal weight. He is not ill-appearing, toxic-appearing or diaphoretic.  HENT:     Head: Normocephalic and atraumatic.     Right Ear: External ear normal.   Left Ear: External ear normal.     Nose: Nose normal.     Mouth/Throat:     Pharynx: Oropharynx is clear.  Eyes:     General: No scleral icterus.       Right eye: No discharge.        Left eye: No discharge.     Extraocular Movements: Extraocular movements intact.     Pupils: Pupils are equal, round, and reactive to light.  Cardiovascular:     Rate and Rhythm: Normal rate.  Pulmonary:     Effort: Pulmonary effort is normal.  Genitourinary:    Penis: Circumcised. No phimosis, paraphimosis, hypospadias, erythema, tenderness, discharge, swelling or lesions.      Testes:        Right: Mass, tenderness or swelling not present. Right testis is descended.        Left: Mass, tenderness or swelling not present. Left testis is descended.     Epididymis:     Right: Not inflamed or enlarged. No mass or tenderness.     Left: Not inflamed or enlarged. No mass or tenderness.  Musculoskeletal:     Cervical back: Normal range of motion.  Lymphadenopathy:     Lower Body: No right inguinal adenopathy. No left inguinal adenopathy.  Neurological:  Mental Status: He is alert and oriented to person, place, and time.  Psychiatric:        Mood and Affect: Mood normal.        Behavior: Behavior normal.        Thought Content: Thought content normal.        Judgment: Judgment normal.      Assessment and Plan :   1. Itching   2. Penile abnormality     Patient has no longer asymptomatic, is worried well.  Counseled on signs of STI including painless chancre/sore, vesicular lesions, fungal infection.  At this time patient prefers not to do any STI testing.  Will monitor symptoms and return to clinic if anything new develops.   Evan Turner, [place] 10/03/19 1953

## 2019-10-03 NOTE — Discharge Instructions (Signed)
If you develop a sore, painful rash, discharge from the penis, painful urination, then please return to our clinic for a recheck. Make sure you wear loose fitting underwear. Check in with your PCP on Monday.

## 2019-10-06 ENCOUNTER — Ambulatory Visit (INDEPENDENT_AMBULATORY_CARE_PROVIDER_SITE_OTHER): Payer: BC Managed Care – PPO | Admitting: Family Medicine

## 2019-10-06 ENCOUNTER — Other Ambulatory Visit: Payer: Self-pay

## 2019-10-06 ENCOUNTER — Encounter: Payer: Self-pay | Admitting: Family Medicine

## 2019-10-06 VITALS — BP 102/70 | HR 108 | Temp 98.2°F | Wt 205.4 lb

## 2019-10-06 DIAGNOSIS — L309 Dermatitis, unspecified: Secondary | ICD-10-CM

## 2019-10-06 NOTE — Progress Notes (Signed)
   Subjective:    Patient ID: Evan Turner., male    DOB: 12-Nov-1995, 24 y.o.   MRN: [id number]  HPI He is here for evaluation of itching to the left side of the shaft of the penis however at this time he is not having difficulty.  He is seen no rash, had no drainage or dysuria.Marland Kitchen  His sexual activity is only with his girlfriend.  Review of Systems     Objective:   Physical Exam Alert and in no distress.  Exam of the penis shows normal skin.       Assessment & Plan:  Dermatitis I reassured him that I found nothing and did not recommend conservative care.  He was comfortable with that.

## 2019-10-06 NOTE — Patient Instructions (Signed)
Use cortisone cream if the itching occurs again.

## 2019-10-30 ENCOUNTER — Telehealth: Payer: Self-pay | Admitting: Medical

## 2019-10-30 NOTE — Telephone Encounter (Signed)
Received requested records from [facility].

## 2019-11-10 ENCOUNTER — Other Ambulatory Visit: Payer: Self-pay

## 2019-11-10 ENCOUNTER — Ambulatory Visit: Payer: BC Managed Care – PPO | Admitting: Medical

## 2019-11-10 ENCOUNTER — Encounter: Payer: Self-pay | Admitting: Medical

## 2019-11-10 VITALS — BP 130/78 | HR 83 | Temp 98.0°F | Ht 72.25 in | Wt 207.0 lb

## 2019-11-10 DIAGNOSIS — R7989 Other specified abnormal findings of blood chemistry: Secondary | ICD-10-CM | POA: Diagnosis not present

## 2019-11-10 DIAGNOSIS — D72819 Decreased white blood cell count, unspecified: Secondary | ICD-10-CM | POA: Diagnosis not present

## 2019-11-10 NOTE — Progress Notes (Signed)
Subjective: Chief Complaint  Patient presents with  . Follow-up   Here to f/u on labs from physical in 09/2019.  He was supposed to return in June for this follow-up but he is going to be moving soon to [place] and wanted to come in and discuss  He is here to follow-up on lab results showing low white counts, elevated TSH, and discuss cholesterol findings  He is healthy, feels healthy, has no specific symptoms he is worried about  Regarding thyroid lab abnormality, he denies fatigue, sluggishness, constipation, skin or hair changes, in general feels fine.  No family history of thyroid disease  In regards to low white counts, he denies frequent infections, no fevers, night sweats, no family history of blood disorder  No past medical history on file.  No current outpatient medications on file prior to visit.   No current facility-administered medications on file prior to visit.   ROS as in subjective   Objective: BP 130/78   Pulse 83   Temp 98 F (36.7 C)   Ht 6' 0.25" (1.835 m)   Wt 207 lb (93.9 kg)   SpO2 97%   BMI 27.88 kg/m   Gen: Well-developed, well-nourished, no acute distress, African-American male Otherwise not examined    Assessment: Encounter Diagnoses  Name Primary?  . Leukopenia, unspecified type Yes  . Elevated TSH      Plan: We discussed lab findings on his March 2021 visit.  His white cells and neutrophils were a little low.  Looking back in his lab records, he had similar reading a year ago and he had low white counts back in 2017 on the CBC without differential.  Thus this appears to be stable.  He has no worrisome symptoms currently.  He has never seen a hematologist.  I advised he plan to repeat CBC with differential labs in 6 months.  If his labs continue to show low white counts he could see hematologist for further evaluation  He had a recent elevated TSH with normal T4.  Looking back thyroid lab a few years ago in 2017 was normal.  The plan was  to repeat this in June.  Since he is moving, I feel like this can be repeated in 6 months instead We discussed symptoms of hypothyroidism.  He is asymptomatic.  Evan Turner was seen today for follow-up.  Diagnoses and all orders for this visit:  Leukopenia, unspecified type  Elevated TSH  Establish with PCP in [place] once you have moved.  You can repeat these labs in 6 months

## 2019-11-10 NOTE — Patient Instructions (Signed)
Encounter Diagnoses  Name Primary?  . Leukopenia, unspecified type Yes  . Elevated TSH     At your recent physical, your labs shows decreased white cells and abnormal TSH thyroid stimulating hormone  I have included some additional reading below.  Your white counts and neutrophils were about the same, low but stable a year ago.  Your white counts were about the same in 2017 on prior labs in the chart record as well.  I don't have any records prior to 2017.  I recommend you repeat the CBC with diff lab in 6 months.   If still low then, consider hematology consult   Your recent TSH thyroid lab was a little off.  I recommend repeating this lab in 6 months, particularly since you have no concerning symptoms of hypothyroidism    Hypothyroidism  Hypothyroidism is when the thyroid gland does not make enough of certain hormones (it is underactive). The thyroid gland is a small gland located in the lower front part of the neck, just in front of the windpipe (trachea). This gland makes hormones that help control how the body uses food for energy (metabolism) as well as how the heart and brain function. These hormones also play a role in keeping your bones strong. When the thyroid is underactive, it produces too little of the hormones thyroxine (T4) and triiodothyronine (T3). What are the causes? This condition may be caused by:  Hashimoto's disease. This is a disease in which the body's disease-fighting system (immune system) attacks the thyroid gland. This is the most common cause.  Viral infections.  Pregnancy.  Certain medicines.  Birth defects.  Past radiation treatments to the head or neck for cancer.  Past treatment with radioactive iodine.  Past exposure to radiation in the environment.  Past surgical removal of part or all of the thyroid.  Problems with a gland in the center of the brain (pituitary gland).  Lack of enough iodine in the diet. What increases the risk? You  are more likely to develop this condition if:  You are male.  You have a family history of thyroid conditions.  You use a medicine called lithium.  You take medicines that affect the immune system (immunosuppressants). What are the signs or symptoms? Symptoms of this condition include:  Feeling as though you have no energy (lethargy).  Not being able to tolerate cold.  Weight gain that is not explained by a change in diet or exercise habits.  Lack of appetite.  Dry skin.  Coarse hair.  Menstrual irregularity.  Slowing of thought processes.  Constipation.  Sadness or depression. How is this diagnosed? This condition may be diagnosed based on:  Your symptoms, your medical history, and a physical exam.  Blood tests. You may also have imaging tests, such as an ultrasound or MRI. How is this treated? This condition is treated with medicine that replaces the thyroid hormones that your body does not make. After you begin treatment, it may take several weeks for symptoms to go away. Follow these instructions at home:  Take over-the-counter and prescription medicines only as told by your health care provider.  If you start taking any new medicines, tell your health care provider.  Keep all follow-up visits as told by your health care provider. This is important. ? As your condition improves, your dosage of thyroid hormone medicine may change. ? You will need to have blood tests regularly so that your health care provider can monitor your condition. Contact a health  care provider if:  Your symptoms do not get better with treatment.  You are taking thyroid replacement medicine and you: ? Sweat a lot. ? Have tremors. ? Feel anxious. ? Lose weight rapidly. ? Cannot tolerate heat. ? Have emotional swings. ? Have diarrhea. ? Feel weak. Get help right away if you have:  Chest pain.  An irregular heartbeat.  A rapid heartbeat.  Difficulty breathing. Summary   Hypothyroidism is when the thyroid gland does not make enough of certain hormones (it is underactive).  When the thyroid is underactive, it produces too little of the hormones thyroxine (T4) and triiodothyronine (T3).  The most common cause is Hashimoto's disease, a disease in which the body's disease-fighting system (immune system) attacks the thyroid gland. The condition can also be caused by viral infections, medicine, pregnancy, or past radiation treatment to the head or neck.  Symptoms may include weight gain, dry skin, constipation, feeling as though you do not have energy, and not being able to tolerate cold.  This condition is treated with medicine to replace the thyroid hormones that your body does not make. This information is not intended to replace advice given to you by your health care provider. Make sure you discuss any questions you have with your health care provider. Document Revised: 06/08/2017 Document Reviewed: 06/06/2017 Elsevier Patient Education  2020 [place] Blood Cell Count Test Why am I having this test? A white blood cell count is done to learn about your white blood cells (leukocytes), which are cells in the blood that help the body fight infection. They are part of the body's defense system (immune system). When the immune system needs more white blood cells, the tissue inside your bones (bone marrow) makes more and releases them into your blood. They travel through your bloodstream to areas of the body where they are needed. This test may be done as part of a routine checkup that includes a complete blood count to measure all the cells in your blood. You may also have this test if you have any signs or symptoms of:  Illness.  Infection.  An allergic reaction.  Inflammation, which is swelling and irritation anywhere in your body. Your white blood cells play a major role in the process of inflammation. What is being tested? This test  measures how many white blood cells you have. Different types of white blood cells make up your total count. These include:  Neutrophils.  Lymphocytes.  Monocytes.  Eosinophils.  Basophils. Your white blood cell count includes a breakdown of how many of each of these cells you have. What kind of sample is taken?  A blood sample is required for this test. It is usually collected by inserting a needle into a blood vessel. Tell a health care provider about:  All medicines you are taking, including vitamins, herbs, eye drops, creams, and over-the-counter medicines.  Any medical conditions you have.  Whether you are pregnant or may be pregnant.  If you have had your spleen removed.  Any recent physical or emotional stress. How are the results reported? Your test results will be reported as numbers for each type of white blood cell and a total count of all the white blood cells in your sample. These will be given as a number of cells per cubic millimeter (mm3) of blood. Your health care provider will compare your results to normal ranges that were established after testing a large group of people (reference ranges).  Reference ranges may vary among labs and hospitals. For this test, common reference ranges are: Total white blood cells:  Adults and children over 67 years of age: 74,000-10,000/mm3.  Children age 28 years or younger: 6,200-17,000/mm3.  Newborn: 9,000-30,000/mm3. Total for each type of white blood cell:  Neutrophils: 2,500-8,000/mm3.  Lymphocytes: 1,000-4,000/mm3.  Monocytes: 100-700/mm3.  Eosinophils: 50-500/mm3.  Basophils: 25-100/mm3. What do the results mean? Results lower than the reference ranges mean that you have a low white blood cell count (leukopenia). Common causes include:  Severe bacterial infections.  Some viral infections.  Some immune system diseases.  Some medicines.  Some bone marrow diseases.  Cancer or cancer treatments. Results  higher than the reference ranges mean that you have a high white blood cell count (leukocytosis). Common causes include:  Bacterial and other infections.  Injury (trauma).  Allergic reactions.  Diseases that cause inflammation.  Stress.  Some medicines.  White blood cell cancers. Talk with your health care provider about what your results mean. Questions to ask your health care provider Ask your health care provider, or the department that is doing the test:  When will my results be ready?  How will I get my results?  What are my treatment options?  What other tests do I need?  What are my next steps? Summary  A white blood cell count is done to learn about your white blood cells (leukocytes), which are the immune system cells in your blood that help you fight infection.  This test may be done as part of a routine checkup. A white blood cell count may also be done if you have signs or symptoms of infection or other illness.  A low white blood cell count is called leukopenia. A high white blood cell count is called leukocytosis. This information is not intended to replace advice given to you by your health care provider. Make sure you discuss any questions you have with your health care provider. Document Revised: 02/21/2017 Document Reviewed: 02/21/2017 Elsevier Patient Education  2020 [employer].     Finally, be mindful of your healthy Lipids in the diet.   Recommendations for improving lipids:  Foods TO AVOID or limit - fried foods, high sugar foods, white bread, enriched flour, fast food, red meat, large amounts of cheese, processed foods such as little debbie cakes, cookies, pies, donuts, for example  Foods TO INCLUDE in the diet - whole grains such as whole grain pasta, whole grain bread, barley, steel cut oatmeal (not instant oatmeal), avocado, fish, green leafy vegetables, nuts, increased fiber in diet, and using olive oil in small amounts for cooking or as  salad dressing vinaigrette.

## 2020-11-25 ENCOUNTER — Other Ambulatory Visit: Payer: Self-pay

## 2020-11-26 ENCOUNTER — Encounter: Payer: Self-pay | Admitting: Medical

## 2020-11-26 ENCOUNTER — Other Ambulatory Visit: Payer: Self-pay

## 2020-11-26 ENCOUNTER — Ambulatory Visit (INDEPENDENT_AMBULATORY_CARE_PROVIDER_SITE_OTHER): Payer: BC Managed Care – PPO | Admitting: Medical

## 2020-11-26 VITALS — BP 130/70 | HR 90 | Temp 97.7°F | Ht 72.0 in | Wt 198.8 lb

## 2020-11-26 DIAGNOSIS — Z113 Encounter for screening for infections with a predominantly sexual mode of transmission: Secondary | ICD-10-CM | POA: Diagnosis not present

## 2020-11-26 DIAGNOSIS — R1013 Epigastric pain: Secondary | ICD-10-CM | POA: Diagnosis not present

## 2020-11-26 DIAGNOSIS — F419 Anxiety disorder, unspecified: Secondary | ICD-10-CM

## 2020-11-26 NOTE — Patient Instructions (Signed)
Begin Pepto-Bismol over-the-counter twice daily for the next 4 days for upset stomach  Begin over-the-counter Prilosec or Nexium for ulcer prevention for the next 2 weeks, 1 tablet daily  We will make a referral to counseling

## 2020-11-26 NOTE — Progress Notes (Signed)
Subjective:  Evan Turner. is a 25 y.o. male who presents for Chief Complaint  Patient presents with  . GI Problem    Feels unsettled      Here for unsettles stomach.  Been feeling this way a few days.  No nausea, no vomiting, no blood in stool.   No loose stool.  Has BM every other day, sometimes may have BM every other day.  Denies constipation, sometimes gas, sometimes belching.  Not sure if he ate something earlier in the week that upset stomach.  Has not used any medicaiton for these symptoms.    Was attributing this to something he ate.   No sick contacts recently.   He is also got a lot of stress.   Was thinking about seeing a Veterinary surgeon.  No other aggravating or relieving factors.    No other c/o.  No past medical history on file.   No current outpatient medications on file prior to visit.   No current facility-administered medications on file prior to visit.    The following portions of the patient's history were reviewed and updated as appropriate: allergies, current medications, past family history, past medical history, past social history, past surgical history and problem list.  ROS Otherwise as in subjective above    Objective: BP 130/70   Pulse 90   Temp 97.7 F (36.5 C)   Ht 6' (1.829 m)   Wt 198 lb 12.8 oz (90.2 kg)   SpO2 98%   BMI 26.96 kg/m   Wt Readings from Last 3 Encounters:  11/26/20 198 lb 12.8 oz (90.2 kg)  11/10/19 207 lb (93.9 kg)  10/06/19 205 lb 6.4 oz (93.2 kg)    General appearance: alert, no distress, well developed, well nourished, African-American male Abdomen: +bs, soft, non tender, non distended, no masses, no hepatomegaly, no splenomegaly Pulses: 2+ radial pulses, 2+ pedal pulses, normal cap refill Ext: no edema Psych: Somewhat anxious otherwise answers questions appropriate, good eye contact     Assessment: Encounter Diagnoses  Name Primary?  . Dyspepsia Yes  . Anxiety   . Screen for STD (sexually  transmitted disease)      Plan: This Pepcid, upset stomach likely due to anxiety.  I recommend he do Pepto-Bismol for the next few days and then over-the-counter Prilosec for the next 2 weeks.  We discussed avoiding food triggers that would upset stomach like onions, spicy foods, fried foods or gas causing foods.  If symptoms worsen or change in a few days call or recheck  Anxiety-referral to counseling  STD screen done at his request.  Advised condom use, safe sex  Evan Turner was seen today for gi problem.  Diagnoses and all orders for this visit:  Dyspepsia -     POCT Urinalysis DIP (Proadvantage Device)  Anxiety -     Ambulatory referral to Psychology  Screen for STD (sexually transmitted disease) -     HIV Antibody (routine testing w rflx) -     RPR -     GC/Chlamydia Probe Amp -     POCT Urinalysis DIP (Proadvantage Device)    Follow up: pending labs, referral

## 2020-11-27 LAB — HIV ANTIBODY (ROUTINE TESTING W REFLEX): HIV Screen 4th Generation wRfx: NONREACTIVE

## 2020-11-27 LAB — RPR: RPR Ser Ql: NONREACTIVE

## 2020-11-28 LAB — GC/CHLAMYDIA PROBE AMP
Chlamydia trachomatis, NAA: NEGATIVE
Neisseria Gonorrhoeae by PCR: NEGATIVE

## 2020-11-29 ENCOUNTER — Telehealth: Payer: Self-pay | Admitting: Medical

## 2020-11-29 NOTE — Telephone Encounter (Signed)
I am positive I requested UA and other labs on his visit.  If UA was missed, we need to get him back for clean catch urinalysis as part of our evaluation

## 2020-12-07 NOTE — Telephone Encounter (Signed)
Pt informed of results and referral has been sent to [facility].

## 2020-12-07 NOTE — Telephone Encounter (Signed)
Status update on this message in my inbox still open?

## 2020-12-08 HISTORY — PX: NO PAST SURGERIES: SHX2092

## 2020-12-30 ENCOUNTER — Encounter: Payer: Self-pay | Admitting: Medical

## 2020-12-30 ENCOUNTER — Other Ambulatory Visit: Payer: Self-pay

## 2020-12-30 ENCOUNTER — Ambulatory Visit (INDEPENDENT_AMBULATORY_CARE_PROVIDER_SITE_OTHER): Payer: BC Managed Care – PPO | Admitting: Medical

## 2020-12-30 VITALS — BP 102/78 | HR 78 | Ht 72.0 in | Wt 199.2 lb

## 2020-12-30 DIAGNOSIS — R7989 Other specified abnormal findings of blood chemistry: Secondary | ICD-10-CM

## 2020-12-30 DIAGNOSIS — Z0000 Encounter for general adult medical examination without abnormal findings: Secondary | ICD-10-CM | POA: Diagnosis not present

## 2020-12-30 DIAGNOSIS — F4329 Adjustment disorder with other symptoms: Secondary | ICD-10-CM | POA: Insufficient documentation

## 2020-12-30 DIAGNOSIS — Z13220 Encounter for screening for lipoid disorders: Secondary | ICD-10-CM | POA: Diagnosis not present

## 2020-12-30 DIAGNOSIS — Z1152 Encounter for screening for COVID-19: Secondary | ICD-10-CM | POA: Diagnosis not present

## 2020-12-30 DIAGNOSIS — Z789 Other specified health status: Secondary | ICD-10-CM | POA: Insufficient documentation

## 2020-12-30 LAB — POC COVID19 BINAXNOW: SARS Coronavirus 2 Ag: POSITIVE — AB

## 2020-12-30 NOTE — Addendum Note (Signed)
Addended by: Victorio Palm on: 12/30/2020 02:12 PM   Modules accepted: Orders

## 2020-12-30 NOTE — Progress Notes (Signed)
Subjective:   HPI  Evan Turner. is a 25 y.o. male who presents for Chief Complaint  Patient presents with   Annual Exam    Physical with fasting labs     Patient Care Team: Maddie Brazier, Leward Quan as PCP - General (Family Medicine) Sees dentist Sees eye doctor  Concerns: Last visit we referred to [facility] ,but he had a bad experience.  Wants to try a different counselor.   He just got back from [place], [place].   Reviewed their medical, surgical, family, social, medication, and allergy history and updated chart as appropriate.  History reviewed. No pertinent past medical history.  Past Surgical History:  Procedure Laterality Date   NO PAST SURGERIES  12/2020    Family History  Problem Relation Age of Onset   Hypertension Mother    Diabetes Maternal Grandmother    Cancer Neg Hx    Heart disease Neg Hx    Vision loss Neg Hx     No current outpatient medications on file.  No Known Allergies   Review of Systems Constitutional: -fever, -chills, -sweats, -unexpected weight change, -decreased appetite, -fatigue Allergy: -sneezing, -itching, -congestion Dermatology: -changing moles, --rash, -lumps ENT: -runny nose, -ear pain, -sore throat, -hoarseness, -sinus pain, -teeth pain, - ringing in ears, -hearing loss, -nosebleeds Cardiology: -chest pain, -palpitations, -swelling, -difficulty breathing when lying flat, -waking up short of breath Respiratory: -cough, -shortness of breath, -difficulty breathing with exercise or exertion, -wheezing, -coughing up blood Gastroenterology: -abdominal pain, -nausea, -vomiting, -diarrhea, -constipation, -blood in stool, -changes in bowel movement, -difficulty swallowing or eating Hematology: -bleeding, -bruising  Musculoskeletal: -joint aches, -muscle aches, -joint swelling, -back pain, -neck pain, -cramping, -changes in gait Ophthalmology: denies vision changes, eye redness, itching, discharge Urology: -burning  with urination, -difficulty urinating, -blood in urine, -urinary frequency, -urgency, -incontinence Neurology: -headache, -weakness, -tingling, -numbness, -memory loss, -falls, -dizziness Psychology: -depressed mood, -agitation, -sleep problems Male GU: no testicular mass, pain, no lymph nodes swollen, no swelling, no rash.  Depression screen [facility] 2/9 11/26/2020 06/26/2017 10/05/2015 08/02/2015 07/28/2015  Decreased Interest 0 0 0 0 0  Down, Depressed, Hopeless 0 0 0 0 0  PHQ - 2 Score 0 0 0 0 0        Objective:  BP 102/78   Pulse 78   Ht 6' (1.829 m)   Wt 199 lb 3.2 oz (90.4 kg)   SpO2 97%   BMI 27.02 kg/m   General appearance: alert, no distress, WD/WN, African American male Skin: unremarkable HEENT: normocephalic, conjunctiva/corneas normal, sclerae anicteric, PERRLA, EOMi, nares patent, no discharge or erythema, pharynx normal Oral cavity: MMM, tongue normal, teeth normal Neck: supple, no lymphadenopathy, no thyromegaly, no masses, normal ROM, no bruits Chest: non tender, normal shape and expansion Heart: RRR, normal S1, S2, no murmurs Lungs: CTA bilaterally, no wheezes, rhonchi, or rales Abdomen: +bs, soft, non tender, non distended, no masses, no hepatomegaly, no splenomegaly, no bruits Back: non tender, normal ROM, no scoliosis Musculoskeletal: upper extremities non tender, no obvious deformity, normal ROM throughout, lower extremities non tender, no obvious deformity, normal ROM throughout Extremities: no edema, no cyanosis, no clubbing Pulses: 2+ symmetric, upper and lower extremities, normal cap refill Neurological: alert, oriented x 3, CN2-12 intact, strength normal upper extremities and lower extremities, sensation normal throughout, DTRs 2+ throughout, no cerebellar signs, gait normal Psychiatric: normal affect, behavior normal, pleasant  GU: normal male external genitalia,circumcised, nontender, no masses, no hernia, no lymphadenopathy Rectal:  deferred  Assessment and Plan :   Encounter Diagnoses  Name Primary?   Encounter for health maintenance examination in adult Yes   Abnormal thyroid blood test    Screening for lipid disorders    H/O foreign travel    Stress and adjustment reaction    Encounter for screening for COVID-19     This visit was a preventative care visit, also known as wellness visit or routine physical.   Topics typically include healthy lifestyle, diet, exercise, preventative care, vaccinations, sick and well care, proper use of emergency dept and after hours care, as well as other concerns.     Recommendations: Continue to return yearly for your annual wellness and preventative care visits.  This gives [place] a chance to discuss healthy lifestyle, exercise, vaccinations, review your chart record, and perform screenings where appropriate.  I recommend you see your eye doctor yearly for routine vision care.  I recommend you see your dentist yearly for routine dental care including hygiene visits twice yearly.   Vaccination recommendations were reviewed Immunization History  Administered Date(s) Administered   Adenovirus 12/17/2014   DTaP 10/16/1995, 02/06/1996, 04/08/1996, 10/24/1996, 01/27/2000   Hepatitis B 10/16/1995, 02/06/1996, 04/08/1996   Hepatitis B, ped/adol 12/17/2014   IPV 10/16/1995, 02/06/1996, 10/24/1996, 11/11/1999, 12/15/2014, 04/08/2016   MMR 09/05/1996, 10/24/1996   Meningococcal Conjugate 12/15/2014   Tdap 02/27/2008, 12/15/2014, 07/19/2017   Varicella 10/24/1996, 02/27/2008, 12/17/2014    Advised yearly flu shot   Screening for cancer: Colon cancer screening: Age 27  Testicular cancer screening You should do a monthly self testicular exam if you are between 26-51 years old  We discussed PSA, prostate exam, and prostate cancer screening risks/benefits.   Age 37  Skin cancer screening: Check your skin regularly for new changes, growing lesions, or other lesions of  concern Come in for evaluation if you have skin lesions of concern.  Lung cancer screening: If you have a greater than 20 pack year history of tobacco use, then you may qualify for lung cancer screening with a chest CT scan.   Please call your insurance company to inquire about coverage for this test.  We currently don't have screenings for other cancers besides breast, cervical, colon, and lung cancers.  If you have a strong family history of cancer or have other cancer screening concerns, please let me know.    Bone health: Get at least 150 minutes of aerobic exercise weekly Get weight bearing exercise at least once weekly Bone density test:  A bone density test is an imaging test that uses a type of X-ray to measure the amount of calcium and other minerals in your bones. The test may be used to diagnose or screen you for a condition that causes weak or thin bones (osteoporosis), predict your risk for a broken bone (fracture), or determine how well your osteoporosis treatment is working. The bone density test is recommended for females 56 and older, or females or males <91 if certain risk factors such as thyroid disease, long term use of steroids such as for asthma or rheumatological issues, vitamin D deficiency, estrogen deficiency, family history of osteoporosis, self or family history of fragility fracture in first degree relative.    Heart health: Get at least 150 minutes of aerobic exercise weekly Limit alcohol It is important to maintain a healthy blood pressure and healthy cholesterol numbers  Heart disease screening: Screening for heart disease includes screening for blood pressure, fasting lipids, glucose/diabetes screening, BMI height to weight ratio, reviewed of smoking  status, physical activity, and diet.    Goals include blood pressure 120/80 or less, maintaining a healthy lipid/cholesterol profile, preventing diabetes or keeping diabetes numbers under good control, not  smoking or using tobacco products, exercising most days per week or at least 150 minutes per week of exercise, and eating healthy variety of fruits and vegetables, healthy oils, and avoiding unhealthy food choices like fried food, fast food, high sugar and high cholesterol foods.    Other tests may possibly include EKG test, CT coronary calcium score, echocardiogram, exercise treadmill stress test.     Medical care options: I recommend you continue to seek care here first for routine care.  We try really hard to have available appointments Monday through Friday daytime hours for sick visits, acute visits, and physicals.  Urgent care should be used for after hours and weekends for significant issues that cannot wait till the next day.  The emergency department should be used for significant potentially life-threatening emergencies.  The emergency department is expensive, can often have long wait times for less significant concerns, so try to utilize primary care, urgent care, or telemedicine when possible to avoid unnecessary trips to the emergency department.  Virtual visits and telemedicine have been introduced since the pandemic started in 2020, and can be convenient ways to receive medical care.  We offer virtual appointments as well to assist you in a variety of options to seek medical care.    Separate significant issues discussed:   Prior abnormal thyroid labs-recheck labs today.  No symptoms.  Stress and adjustment reaction-I apologize the referral through [facility] did not go well.  I gave him a list of local counselors I recommended he establish with 1 of those  Recent foreign travel, he requested COVID screening.  He is asymptomatic, but tested positive today.  He just got back from [place] on vacation.  Unfortunately was positive.  We discussed quarantine for 5 days, and if no symptoms after 5 days he can resume work but with mask on for additional 5 days.  If he does become symptomatic he  should call back to the office to discuss symptoms and other recommendations   Amay was seen today for annual exam.  Diagnoses and all orders for this visit:  Encounter for health maintenance examination in adult -     Lipid panel -     Comprehensive metabolic panel -     CBC with Differential/Platelet -     TSH + free T4  Abnormal thyroid blood test -     TSH + free T4  Screening for lipid disorders -     Lipid panel  H/O foreign travel  Stress and adjustment reaction  Encounter for screening for COVID-19   Follow-up pending labs, yearly for physical

## 2020-12-30 NOTE — Patient Instructions (Signed)
RESOURCES in [place], [place]  If you are experiencing a mental health crisis or an emergency, please call 911 or go to the nearest emergency department.  [facility]   [phone] [facility]  [phone] [facility]   [phone]  Suicide Hotline 1-800-Suicide (1-800-784-2433)  National Suicide Prevention Lifeline 1-800-273-TALK  (1-800-273-8255)  Domestic Violence, Rape/Crisis - [facility] 336-273-7273  The National Domestic Violence Hotline 1-800-799-SAFE (1-800-799-7233)  To report Child or Elder Abuse, please call: [place] Police Department  [phone] [place] [employer]  [phone]  Teen Crisis line 336-387-6161 or 1-877-332-7333       Counseling Services (NON- psychiatrist offices)  Dr. Claude Ragan, [place]ter [phone] [address] [place], [place] [address]   [facility] [address], [place], [place] [address] [phone]   [facility] [phone] [address], [place], [place] [address]   [facility] [phone]  [email or web address] [address], [place], [place] [address]   Merrianne M. Leff, therapist [phone] [address] [place], [place] [address]   [facility] [phone] [address], [place], [place] [address]   Jill White-Huffman, therapist [phone] [address], [place], [place] [address]   [facility] [phone] [address], [place], [place] [address]   [facility] [phone] [address] [place], [place] [address] Crisis services, Family support, in home therapy, treatment for Anxiety, PTSD, Sexual Assault, Substance Abuse, Financial/Credit Counseling, Variety of other services   

## 2020-12-31 LAB — CBC WITH DIFFERENTIAL/PLATELET
Basophils Absolute: 0 10*3/uL (ref 0.0–0.2)
Basos: 1 %
EOS (ABSOLUTE): 0.1 10*3/uL (ref 0.0–0.4)
Eos: 3 %
Hematocrit: 44.2 % (ref 37.5–51.0)
Hemoglobin: 15 g/dL (ref 13.0–17.7)
Immature Grans (Abs): 0 10*3/uL (ref 0.0–0.1)
Immature Granulocytes: 0 %
Lymphocytes Absolute: 0.8 10*3/uL (ref 0.7–3.1)
Lymphs: 25 %
MCH: 27.9 pg (ref 26.6–33.0)
MCHC: 33.9 g/dL (ref 31.5–35.7)
MCV: 82 fL (ref 79–97)
Monocytes Absolute: 0.6 10*3/uL (ref 0.1–0.9)
Monocytes: 20 %
Neutrophils Absolute: 1.6 10*3/uL (ref 1.4–7.0)
Neutrophils: 51 %
Platelets: 167 10*3/uL (ref 150–450)
RBC: 5.37 x10E6/uL (ref 4.14–5.80)
RDW: 13.1 % (ref 11.6–15.4)
WBC: 3.2 10*3/uL — ABNORMAL LOW (ref 3.4–10.8)

## 2020-12-31 LAB — COMPREHENSIVE METABOLIC PANEL
ALT: 20 IU/L (ref 0–44)
AST: 20 IU/L (ref 0–40)
Albumin/Globulin Ratio: 1.6 (ref 1.2–2.2)
Albumin: 4.5 g/dL (ref 4.1–5.2)
Alkaline Phosphatase: 67 IU/L (ref 44–121)
BUN/Creatinine Ratio: 9 (ref 9–20)
BUN: 11 mg/dL (ref 6–20)
Bilirubin Total: 0.4 mg/dL (ref 0.0–1.2)
CO2: 23 mmol/L (ref 20–29)
Calcium: 9.5 mg/dL (ref 8.7–10.2)
Chloride: 102 mmol/L (ref 96–106)
Creatinine, Ser: 1.23 mg/dL (ref 0.76–1.27)
Globulin, Total: 2.9 g/dL (ref 1.5–4.5)
Glucose: 80 mg/dL (ref 65–99)
Potassium: 4.4 mmol/L (ref 3.5–5.2)
Sodium: 138 mmol/L (ref 134–144)
Total Protein: 7.4 g/dL (ref 6.0–8.5)
eGFR: 84 mL/min/{1.73_m2} (ref >59)

## 2020-12-31 LAB — TSH+FREE T4
Free T4: 1.21 ng/dL (ref 0.82–1.77)
TSH: 3.17 u[IU]/mL (ref 0.450–4.500)

## 2020-12-31 LAB — LIPID PANEL
Chol/HDL Ratio: 2.9 ratio (ref 0.0–5.0)
Cholesterol, Total: 212 mg/dL — ABNORMAL HIGH (ref 100–199)
HDL: 74 mg/dL (ref >39)
LDL Chol Calc (NIH): 123 mg/dL — ABNORMAL HIGH (ref 0–99)
Triglycerides: 84 mg/dL (ref 0–149)
VLDL Cholesterol Cal: 15 mg/dL (ref 5–40)

## 2021-03-22 ENCOUNTER — Ambulatory Visit (HOSPITAL_COMMUNITY)
Admission: EM | Admit: 2021-03-22 | Discharge: 2021-03-22 | Disposition: A | Discharge status: Home / Self Care | Payer: BC Managed Care – PPO

## 2021-03-22 ENCOUNTER — Encounter (HOSPITAL_COMMUNITY): Payer: Self-pay

## 2021-03-22 ENCOUNTER — Other Ambulatory Visit: Payer: Self-pay

## 2021-03-22 DIAGNOSIS — S61214A Laceration without foreign body of right ring finger without damage to nail, initial encounter: Secondary | ICD-10-CM | POA: Diagnosis not present

## 2021-03-22 NOTE — ED Provider Notes (Signed)
[facility]    CSN: [id number] Arrival date & time: 03/22/21  1718      History   Chief Complaint Chief Complaint  Patient presents with   Laceration    (In Car [phone])    HPI Hymie Gorr Lovings Montez Hageman. is a 25 y.o. male.   Right fourth finger laceration 2 days ago cut his finger with a razor in his bag Reports that he is up to date on his tetanus shot Denies fevers Reports full motion of his finger but finds the area tender Otherwise feeling well   History reviewed. No pertinent past medical history.  Patient Active Problem List   Diagnosis Date Noted   Encounter for health maintenance examination in adult 12/30/2020   Abnormal thyroid blood test 12/30/2020   H/O foreign travel 12/30/2020   Stress and adjustment reaction 12/30/2020   Leukopenia 11/10/2019   Elevated TSH 11/10/2019   Screening for lipid disorders 09/09/2019   Dysuria 09/09/2019   Acute upper respiratory infection 07/28/2015    Past Surgical History:  Procedure Laterality Date   NO PAST SURGERIES  12/2020       Home Medications    Prior to Admission medications   Not on File    Family History Family History  Problem Relation Age of Onset   Hypertension Mother    Diabetes Maternal Grandmother    Cancer Neg Hx    Heart disease Neg Hx    Vision loss Neg Hx     Social History Social History   Tobacco Use   Smoking status: Never   Smokeless tobacco: Never  Vaping Use   Vaping Use: Never used  Substance Use Topics   Alcohol use: Yes    Alcohol/week: 4.0 standard drinks    Types: 4 Standard drinks or equivalent per week    Comment: socially   Drug use: No     Allergies   Patient has no known allergies.   Review of Systems Review of Systems  All other systems reviewed and are negative. Per HPI  Physical Exam Triage Vital Signs ED Triage Vitals  Enc Vitals Group     BP 03/22/21 1810 128/70     Pulse Rate 03/22/21 1810 73     Resp 03/22/21 1810 16      Temp 03/22/21 1810 98.1 F (36.7 C)     Temp Source 03/22/21 1810 Oral     SpO2 03/22/21 1810 98 %     Weight --      Height --      Head Circumference --      Peak Flow --      Pain Score 03/22/21 1809 0     Pain Loc --      Pain Edu? --      Excl. in [place]? --    No data found.  Updated Vital Signs BP 128/70 (BP Location: Right Arm)   Pulse 73   Temp 98.1 F (36.7 C) (Oral)   Resp 16   SpO2 98%   Visual Acuity Right Eye Distance:   Left Eye Distance:   Bilateral Distance:    Right Eye Near:   Left Eye Near:    Bilateral Near:     Physical Exam Constitutional:      Appearance: Normal appearance.  HENT:     Head: Normocephalic and atraumatic.  Cardiovascular:     Rate and Rhythm: Normal rate.  Pulmonary:     Effort: Pulmonary effort is normal.  Musculoskeletal:  Right hand: Normal capillary refill. Normal pulse.     Left hand: Normal capillary refill. Normal pulse.       Hands:     Comments: Flexion and extension preserved DIP and PIP of involved finger   Skin:    General: Skin is warm and dry.  Neurological:     Mental Status: He is alert and oriented to person, place, and time.     UC Treatments / Results  Labs (all labs ordered are listed, but only abnormal results are displayed) Labs Reviewed - No data to display  EKG   Radiology No results found.  Procedures Procedures (including critical care time)  Medications Ordered in UC Medications - No data to display  Initial Impression / Assessment and Plan / UC Course  I have reviewed the triage vital signs and the nursing notes.  Pertinent labs & imaging results that were available during my care of the patient were reviewed by me and considered in my medical decision making (see chart for details).     Patient is a 25 year old previously healthy male who presents with a finger laceration that occurred 2 days ago.  He reports he is up-to-date on his tetanus and declines a tetanus  vaccination today.  Did discuss with him that given that this occurred 2 days ago, would not do any stitches at this time.  The wound edges are currently approximated.  Discussed keeping the wound clean and dry as well as using topical ointment.  Given return precautions for signs of infection, see AVS.  Advised follow-up with PCP if needed.  He was discharged home in stable condition.   Final Clinical Impressions(s) / UC Diagnoses   Final diagnoses:  Laceration of right ring finger without foreign body without damage to nail, initial encounter     Discharge Instructions      As we discussed, it is too late to do any stitches for your laceration.  We will have you keep the area clean and dry.  You should use warm soapy water at least twice a day to keep this clean.  You can also put Neosporin over this or triple antibiotic ointment and put gauze over it and tape for the next few days since it is so tender to touch.  Once it starts to feel better, you can use bandages.  If you start to notice increasing pain, redness, pus, fever, you should be seen by medical provider right away.     ED Prescriptions   None    PDMP not reviewed this encounter.   Unknown Jim, DO 03/22/21 1902

## 2021-03-22 NOTE — Discharge Instructions (Addendum)
As we discussed, it is too late to do any stitches for your laceration.  We will have you keep the area clean and dry.  You should use warm soapy water at least twice a day to keep this clean.  You can also put Neosporin over this or triple antibiotic ointment and put gauze over it and tape for the next few days since it is so tender to touch.  Once it starts to feel better, you can use bandages.  If you start to notice increasing pain, redness, pus, fever, you should be seen by medical provider right away.

## 2021-03-22 NOTE — ED Triage Notes (Signed)
Pt presents with laceration in the right ring finger x 2 days. Pt reports he cut with a razor in his bag,

## 2021-03-31 ENCOUNTER — Encounter: Payer: Self-pay | Admitting: Medical

## 2021-03-31 ENCOUNTER — Ambulatory Visit (INDEPENDENT_AMBULATORY_CARE_PROVIDER_SITE_OTHER): Payer: BC Managed Care – PPO | Admitting: Medical

## 2021-03-31 ENCOUNTER — Other Ambulatory Visit: Payer: Self-pay

## 2021-03-31 VITALS — BP 130/86 | HR 83 | Wt 209.4 lb

## 2021-03-31 DIAGNOSIS — S61214D Laceration without foreign body of right ring finger without damage to nail, subsequent encounter: Secondary | ICD-10-CM | POA: Diagnosis not present

## 2021-03-31 DIAGNOSIS — Z5189 Encounter for other specified aftercare: Secondary | ICD-10-CM

## 2021-03-31 DIAGNOSIS — F419 Anxiety disorder, unspecified: Secondary | ICD-10-CM | POA: Diagnosis not present

## 2021-03-31 DIAGNOSIS — S61214A Laceration without foreign body of right ring finger without damage to nail, initial encounter: Secondary | ICD-10-CM | POA: Insufficient documentation

## 2021-03-31 DIAGNOSIS — F329 Major depressive disorder, single episode, unspecified: Secondary | ICD-10-CM

## 2021-03-31 DIAGNOSIS — Z113 Encounter for screening for infections with a predominantly sexual mode of transmission: Secondary | ICD-10-CM | POA: Diagnosis not present

## 2021-03-31 DIAGNOSIS — F32A Depression, unspecified: Secondary | ICD-10-CM

## 2021-03-31 NOTE — Progress Notes (Signed)
Subjective:  Evan Turner. is a 25 y.o. male who presents for Chief Complaint  Patient presents with   finger recheck    Finger recheck     Here for wound recheck.  He cut his right ring finger on September 11 reaching into a bag and cut himself with his razor that he shaves with.  It cut relatively deep but he waited 2 days to go in urgent care to have sutures.  It needed sutures but by that point the wound was too old.  So they had him bandaged the wound and let it heal from secondary intention.  He notes at this point it is healing up but he does not like the way the finger shaped.  He has been range of motion, no numbness or tingling, no pus or bleeding.  It bled a lot the day of the wound but not since.  He is right-handed  He would like STD screening.  He gets this periodically.  No current symptoms of concern.  No new partners.  Last testing May 2022  He is still dealing with some anxiety and depressed mood.  He tried counseling once in the past year but he did not feel like the counselor was a good fit.  He does not want to use medication at this time.  He is trying to use other techniques that we discussed before to deal with stress.  No HI, no SI.  No other aggravating or relieving factors.    No other c/o.  The following portions of the patient's history were reviewed and updated as appropriate: allergies, current medications, past family history, past medical history, past social history, past surgical history and problem list.  ROS Otherwise as in subjective above  Objective: BP 130/86   Pulse 83   Wt 209 lb 6.4 oz (95 kg)   BMI 28.40 kg/m   General appearance: alert, no distress, well developed, well nourished Psych: Pleasant, answers questions appropriate, good eye contact Right fourth finger, ring finger with healing laceration wound of the medial aspect from the tip of the finger to the medial aspect approximately 3.4 cm long.  There is some localized  tissue deformity of the fingertip from the recent wound but no induration no fluctuance no signs of infection, no warmth.  No redness. Finger strength and sensation is normal Otherwise hands and fingers normal-appearing with good pulses and normal cap refill     Assessment: Encounter Diagnoses  Name Primary?   Laceration of right ring finger without foreign body, nail damage status unspecified, subsequent encounter Yes   Visit for wound check    Screen for STD (sexually transmitted disease)    Anxiety    Mood disorder of depressed type      Plan: Anxiety, mood depressed as she declines medication at this time.  I recommended counseling again.  He did see a counselor 1 point in the past year but did not hit it off well with them.  I recommend he try again with a different counselor.  I gave him a list of counselors to choose from  We discussed ways to deal with stress and anxiety. I recommend regular exercise such as 30 minutes or more most days of the week such as walking running and bicycling I recommend taking some time to meditate or pray daily to help slow racing thoughts. I recommend working on relaxation techniques such as deep breathing exercises in a comfortable position relaxing your body.  There are  free Apps on the smart phone for this for example Consider getting a massage Journal or use diary to express your ideas on paper to cope with anxiety and stress Work on time management, use a calendar or plan out things to avoid stressing about things. Find ways to utilize your time to include exercise and personal "me" time. Some people use aromatherapy such as lavender to relax Some people use herbal teas to help calm their mood Spend some time with animals or your pet if you have one Consider seeing a counselor to help deal with anxiety and work on specific techniques   Finger laceration 2 weeks ago-healing appropriately.  We discussed that it can take several weeks for the  wound to fully heal and for tissue to remodel underneath.  No signs of infection today, no obvious strength issue or tendon injury.  Advised to use vitamin E ointment or cocoa butter daily for the next couple weeks for scar prevention   Screening for STD at his request today, continue to use condoms and practice safe sex  Evan Turner was seen today for finger recheck.  Diagnoses and all orders for this visit:  Laceration of right ring finger without foreign body, nail damage status unspecified, subsequent encounter  Visit for wound check  Screen for STD (sexually transmitted disease) -     RPR+HIV+GC+CT Panel -     Hepatitis C antibody -     Hepatitis B surface antigen  Anxiety  Mood disorder of depressed type    Follow up: pending labs

## 2021-03-31 NOTE — Patient Instructions (Signed)
RESOURCES in [place], [place]  If you are experiencing a mental health crisis or an emergency, please call 911 or go to the nearest emergency department.  [facility]   [phone] [facility]  [phone] [facility]   [phone]  Suicide Hotline 1-800-Suicide 563-854-8292)  National Suicide Prevention Lifeline 4185082624  8067649803)  Domestic Violence, Rape/Crisis - [facility] 694-503-8882  [employer] Violence Hotline 1-800-799-SAFE 480-555-5818)  To report Child or Elder Abuse, please call: [facility] Police Department  [phone] [facility] Department  [phone]  Teen Crisis line 757-488-5348 or [phone]     Psychiatry and Counseling services   Dr. Nicole Cella, [place] [phone] [address] [place], [place] [address]  [facility] [phone] [address], [place], [place] [address]   [facility] [phone]  [email or web address] [address] Mervyn Skeeters [place], [place] [address]   [facility] [phone] [address] [place], [place] [address]   Glade Lloyd, therapist [phone] [address] [place], [place] [address]   [facility] [phone] [address] [place], [place], [place] [address]

## 2021-04-03 LAB — HEPATITIS B SURFACE ANTIGEN: Hepatitis B Surface Ag: NEGATIVE

## 2021-04-03 LAB — HEPATITIS C ANTIBODY: Hep C Virus Ab: <0.1 s/co ratio (ref 0.0–0.9)

## 2021-04-03 LAB — RPR+HIV+GC+CT PANEL
Chlamydia trachomatis, NAA: NEGATIVE
HIV Screen 4th Generation wRfx: NONREACTIVE
Neisseria Gonorrhoeae by PCR: NEGATIVE
RPR Ser Ql: NONREACTIVE

## 2021-06-09 ENCOUNTER — Telehealth: Payer: Self-pay | Admitting: Medical

## 2021-06-09 NOTE — Telephone Encounter (Signed)
Dismissal letter in guarantor snapshot  °

## 2021-07-13 ENCOUNTER — Ambulatory Visit (HOSPITAL_COMMUNITY)
Admission: EM | Admit: 2021-07-13 | Discharge: 2021-07-13 | Disposition: A | Discharge status: Home / Self Care | Payer: BC Managed Care – PPO | Attending: Physician Assistant | Admitting: Physician Assistant

## 2021-07-13 ENCOUNTER — Encounter (HOSPITAL_COMMUNITY): Payer: Self-pay

## 2021-07-13 ENCOUNTER — Other Ambulatory Visit: Payer: Self-pay

## 2021-07-13 DIAGNOSIS — R369 Urethral discharge, unspecified: Secondary | ICD-10-CM | POA: Diagnosis not present

## 2021-07-13 LAB — POCT URINALYSIS DIPSTICK, ED / UC
Bilirubin Urine: NEGATIVE
Glucose, UA: NEGATIVE mg/dL
Leukocytes,Ua: NEGATIVE
Nitrite: NEGATIVE
Protein, ur: 30 mg/dL — AB
Specific Gravity, Urine: 1.02 (ref 1.005–1.030)
Urobilinogen, UA: 1 mg/dL (ref 0.0–1.0)
pH: 7 (ref 5.0–8.0)

## 2021-07-13 NOTE — ED Triage Notes (Signed)
Pt presents to the office for Dysuria x 1 month.

## 2021-07-13 NOTE — ED Provider Notes (Signed)
[facility]    CSN: [id number] Arrival date & time: 07/13/21  1511      History   Chief Complaint Chief Complaint  Patient presents with   Urinary Tract Infection    HPI Evan Turner. is a 26 y.o. male.   Patient presents today with a month-long history of intermittent clear penile discharge.  He reports he has not had symptoms recently but wanted to be checked to make sure there is nothing else going on.  He denies any dysuria, urinary frequency, urinary urgency, fever, abdominal pain, pelvic pain.  He does report having STI when he was in college but denies any recent exposures.  He is sexually active but has no concern for STI exposure.  Denies history of nephrolithiasis.  Denies any recent antibiotic use.   History reviewed. No pertinent past medical history.  Patient Active Problem List   Diagnosis Date Noted   Laceration of right ring finger 03/31/2021   Encounter for health maintenance examination in adult 12/30/2020   Abnormal thyroid blood test 12/30/2020   H/O foreign travel 12/30/2020   Stress and adjustment reaction 12/30/2020   Leukopenia 11/10/2019   Elevated TSH 11/10/2019   Screening for lipid disorders 09/09/2019   Dysuria 09/09/2019   Acute upper respiratory infection 07/28/2015    Past Surgical History:  Procedure Laterality Date   NO PAST SURGERIES  12/2020       Home Medications    Prior to Admission medications   Not on File    Family History Family History  Problem Relation Age of Onset   Hypertension Mother    Diabetes Maternal Grandmother    Cancer Neg Hx    Heart disease Neg Hx    Vision loss Neg Hx     Social History Social History   Tobacco Use   Smoking status: Never   Smokeless tobacco: Never  Vaping Use   Vaping Use: Never used  Substance Use Topics   Alcohol use: Yes    Alcohol/week: 4.0 standard drinks    Types: 4 Standard drinks or equivalent per week    Comment: socially   Drug use: No      Allergies   Patient has no known allergies.   Review of Systems Review of Systems  Constitutional:  Negative for activity change, appetite change, fatigue and fever.  Respiratory:  Negative for cough.   Cardiovascular:  Negative for chest pain.  Gastrointestinal:  Negative for abdominal pain, diarrhea, nausea and vomiting.  Genitourinary:  Positive for penile discharge. Negative for dysuria, penile pain and urgency.  Neurological:  Negative for dizziness, light-headedness and headaches.    Physical Exam Triage Vital Signs ED Triage Vitals  Enc Vitals Group     BP 07/13/21 1706 (!) 154/91     Pulse Rate 07/13/21 1706 72     Resp 07/13/21 1706 18     Temp 07/13/21 1706 98.7 F (37.1 C)     Temp src --      SpO2 07/13/21 1706 100 %     Weight --      Height --      Head Circumference --      Peak Flow --      Pain Score 07/13/21 1707 4     Pain Loc --      Pain Edu? --      Excl. in [place]? --    No data found.  Updated Vital Signs BP (!) 154/91 (BP Location: Left  Arm)    Pulse 72    Temp 98.7 F (37.1 C)    Resp 18    SpO2 100%   Visual Acuity Right Eye Distance:   Left Eye Distance:   Bilateral Distance:    Right Eye Near:   Left Eye Near:    Bilateral Near:     Physical Exam Vitals reviewed.  Constitutional:      General: He is awake.     Appearance: Normal appearance. He is well-developed. He is not ill-appearing.     Comments: Very pleasant male appears stated age in no acute distress sitting comfortably in exam room  HENT:     Head: Normocephalic and atraumatic.     Mouth/Throat:     Pharynx: No oropharyngeal exudate, posterior oropharyngeal erythema or uvula swelling.  Cardiovascular:     Rate and Rhythm: Normal rate and regular rhythm.     Heart sounds: Normal heart sounds, S1 normal and S2 normal. No murmur heard. Pulmonary:     Effort: Pulmonary effort is normal.     Breath sounds: Normal breath sounds. No stridor. No wheezing, rhonchi or  rales.     Comments: Clear to auscultation bilaterally Abdominal:     General: Bowel sounds are normal.     Palpations: Abdomen is soft.     Tenderness: There is no abdominal tenderness.  Genitourinary:    Comments: Exam deferred Neurological:     Mental Status: He is alert.  Psychiatric:        Behavior: Behavior is cooperative.     UC Treatments / Results  Labs (all labs ordered are listed, but only abnormal results are displayed) Labs Reviewed  POCT URINALYSIS DIPSTICK, ED / UC - Abnormal; Notable for the following components:      Result Value   Ketones, ur TRACE (*)    Hgb urine dipstick TRACE (*)    Protein, ur 30 (*)    All other components within normal limits  URINE CULTURE  CYTOLOGY, (ORAL, ANAL, URETHRAL) ANCILLARY ONLY    EKG   Radiology No results found.  Procedures Procedures (including critical care time)  Medications Ordered in UC Medications - No data to display  Initial Impression / Assessment and Plan / UC Course  I have reviewed the triage vital signs and the nursing notes.  Pertinent labs & imaging results that were available during my care of the patient were reviewed by me and considered in my medical decision making (see chart for details).     UA showed evidence of decreased oral intake but no evidence of infection.  Given symptoms we will send this off for culture to rule out UTI as contributing to symptoms.  Discussed that discharge is more commonly associated with STI so STI swab was collected today.  Patient reports that symptoms are intermittent and he is currently asymptomatic so we will defer treatment until results are obtained.  Recommended he avoid sexual contact until results are obtained and treatment is completed if necessary.  Discussed the importance of safe sex practices.  Discussed that if he has any worsening symptoms including abdominal pain, recurrent discharge, urinary symptoms, fever, nausea, vomiting he needs to be seen  immediately.  Strict return precautions given to which he expressed understanding.  Final Clinical Impressions(s) / UC Diagnoses   Final diagnoses:  Penile discharge     Discharge Instructions      Your urine was normal.  We will send this off for culture to ensure there is  not an infection.  We will contact you if we need to arrange any treatment based on this result as well as your swab result.  Please abstain from sex until you receive your result and you have completed all treatment as needed.  It is important you use a condom with each sexual encounter.  If you have any recurrent symptoms including abdominal pain, pelvic pain, burning when you go to the bathroom, fever, nausea, vomiting you need to be seen immediately.     ED Prescriptions   None    PDMP not reviewed this encounter.   Terrilee Croak, PA-C 07/13/21 1753

## 2021-07-13 NOTE — Discharge Instructions (Signed)
Your urine was normal.  We will send this off for culture to ensure there is not an infection.  We will contact you if we need to arrange any treatment based on this result as well as your swab result.  Please abstain from sex until you receive your result and you have completed all treatment as needed.  It is important you use a condom with each sexual encounter.  If you have any recurrent symptoms including abdominal pain, pelvic pain, burning when you go to the bathroom, fever, nausea, vomiting you need to be seen immediately.

## 2021-07-14 LAB — CYTOLOGY, (ORAL, ANAL, URETHRAL) ANCILLARY ONLY
Chlamydia: NEGATIVE
Comment: NEGATIVE
Comment: NEGATIVE
Comment: NORMAL
Neisseria Gonorrhea: NEGATIVE
Trichomonas: NEGATIVE

## 2021-07-15 LAB — URINE CULTURE: Culture: NO GROWTH

## 2023-08-08 ENCOUNTER — Ambulatory Visit (HOSPITAL_COMMUNITY)
Admission: EM | Admit: 2023-08-08 | Discharge: 2023-08-08 | Disposition: A | Discharge status: Home / Self Care | Payer: Medicaid Other | Attending: Family Medicine | Admitting: Family Medicine

## 2023-08-08 ENCOUNTER — Ambulatory Visit (INDEPENDENT_AMBULATORY_CARE_PROVIDER_SITE_OTHER): Payer: Medicaid Other

## 2023-08-08 ENCOUNTER — Other Ambulatory Visit: Payer: Self-pay

## 2023-08-08 ENCOUNTER — Encounter (HOSPITAL_COMMUNITY): Payer: Self-pay | Admitting: Emergency Medicine

## 2023-08-08 DIAGNOSIS — R051 Acute cough: Secondary | ICD-10-CM | POA: Diagnosis present

## 2023-08-08 DIAGNOSIS — R509 Fever, unspecified: Secondary | ICD-10-CM | POA: Insufficient documentation

## 2023-08-08 DIAGNOSIS — R6889 Other general symptoms and signs: Secondary | ICD-10-CM | POA: Insufficient documentation

## 2023-08-08 LAB — POC COVID19/FLU A&B COMBO
Covid Antigen, POC: NEGATIVE
Influenza A Antigen, POC: NEGATIVE
Influenza B Antigen, POC: NEGATIVE

## 2023-08-08 MED ORDER — ACETAMINOPHEN 325 MG PO TABS
650.0000 mg | ORAL_TABLET | Freq: Once | ORAL | Status: AC
  Administered 2023-08-08: 650 mg via ORAL

## 2023-08-08 MED ORDER — ACETAMINOPHEN 325 MG PO TABS
ORAL_TABLET | ORAL | Status: AC
  Filled 2023-08-08: qty 2

## 2023-08-08 MED ORDER — OSELTAMIVIR PHOSPHATE 75 MG PO CAPS: 75.0000 mg | ORAL_CAPSULE | Freq: Two times a day (BID) | ORAL | 0 refills | Status: DC

## 2023-08-08 MED ORDER — BENZONATATE 200 MG PO CAPS: 200.0000 mg | ORAL_CAPSULE | Freq: Three times a day (TID) | ORAL | 0 refills | Status: DC | PRN

## 2023-08-08 NOTE — ED Triage Notes (Signed)
Complains of cough , fever, headache, stuffy nose.  Patient has not had any medications for symptoms  Patient requesting STI testing since he is already here.  Denies symptoms

## 2023-08-08 NOTE — ED Provider Notes (Signed)
[facility]    CSN: [id number] Arrival date & time: 08/08/23  1524      History   Chief Complaint Chief Complaint  Patient presents with   Cough    HPI Evan Turner. is a 28 y.o. male who woke up with HA and fever of 101 around 1 pm, and cough and aches today. Last night had a little scratchy throat. Has not had a flu shot this season. He checked his temp this am, and was normal.   2- Wants STD screening test done. He denies being sexually active without use of protection.  History reviewed. No pertinent past medical history.  Patient Active Problem List   Diagnosis Date Noted   Laceration of right ring finger 03/31/2021   Encounter for health maintenance examination in adult 12/30/2020   Abnormal thyroid blood test 12/30/2020   H/O foreign travel 12/30/2020   Stress and adjustment reaction 12/30/2020   Leukopenia 11/10/2019   Elevated TSH 11/10/2019   Screening for lipid disorders 09/09/2019   Dysuria 09/09/2019   Acute upper respiratory infection 07/28/2015    Past Surgical History:  Procedure Laterality Date   NO PAST SURGERIES  12/2020       Home Medications    Prior to Admission medications   Medication Sig Start Date End Date Taking? Authorizing Provider  benzonatate (TESSALON) 200 MG capsule Take 1 capsule (200 mg total) by mouth 3 (three) times daily as needed for cough. 08/08/23  Yes Rodriguez-Southworth, Nettie Elm, PA-C  oseltamivir (TAMIFLU) 75 MG capsule Take 1 capsule (75 mg total) by mouth every 12 (twelve) hours. 08/08/23  Yes Rodriguez-Southworth, Nettie Elm, PA-C    Family History Family History  Problem Relation Age of Onset   Hypertension Mother    Diabetes Maternal Grandmother    Cancer Neg Hx    Heart disease Neg Hx    Vision loss Neg Hx     Social History Social History   Tobacco Use   Smoking status: Never   Smokeless tobacco: Never  Vaping Use   Vaping status: Never Used  Substance Use Topics   Alcohol use:  Yes    Alcohol/week: 4.0 standard drinks of alcohol    Types: 4 Standard drinks or equivalent per week    Comment: socially   Drug use: No     Allergies   Patient has no known allergies.   Review of Systems Review of Systems  As noted in HPI Physical Exam Triage Vital Signs ED Triage Vitals  Encounter Vitals Group     BP 08/08/23 1713 126/69     Systolic BP Percentile --      Diastolic BP Percentile --      Pulse Rate 08/08/23 1713 76     Resp 08/08/23 1713 20     Temp 08/08/23 1713 (!) 101 F (38.3 C)     Temp Source 08/08/23 1713 Oral     SpO2 08/08/23 1713 97 %     Weight --      Height --      Head Circumference --      Peak Flow --      Pain Score 08/08/23 1711 5     Pain Loc --      Pain Education --      Exclude from Growth Chart --    No data found.  Updated Vital Signs BP 126/69 (BP Location: Left Arm) Comment (BP Location): large cuff  Pulse 76   Temp (!) 101  F (38.3 C) (Oral)   Resp 20   SpO2 97%   Visual Acuity Right Eye Distance:   Left Eye Distance:   Bilateral Distance:    Right Eye Near:   Left Eye Near:    Bilateral Near:     Physical Exam Vitals and nursing note reviewed.  Constitutional:      General: He is not in acute distress.    Appearance: He is normal weight. He is not ill-appearing, toxic-appearing or diaphoretic.  HENT:     Right Ear: Tympanic membrane and ear canal normal. There is impacted cerumen.     Left Ear: Tympanic membrane, ear canal and external ear normal.     Nose: Rhinorrhea present.     Mouth/Throat:     Mouth: Mucous membranes are moist.     Pharynx: Oropharynx is clear.  Eyes:     Conjunctiva/sclera: Conjunctivae normal.  Cardiovascular:     Rate and Rhythm: Normal rate and regular rhythm.     Heart sounds: No murmur heard. Pulmonary:     Effort: Pulmonary effort is normal.     Breath sounds: Normal breath sounds.  Musculoskeletal:        General: Normal range of motion.     Cervical back: Neck  supple.  Lymphadenopathy:     Cervical: No cervical adenopathy.  Skin:    General: Skin is warm and dry.     Findings: No rash.  Neurological:     Mental Status: He is alert and oriented to person, place, and time.     Gait: Gait normal.  Psychiatric:        Mood and Affect: Mood normal.        Behavior: Behavior normal.        Thought Content: Thought content normal.        Judgment: Judgment normal.      UC Treatments / Results  Labs (all labs ordered are listed, but only abnormal results are displayed) Labs Reviewed  POC COVID19/FLU A&B COMBO  CYTOLOGY, (ORAL, ANAL, URETHRAL) ANCILLARY ONLY   Has negative Flu and Covid tests.  EKG   Radiology No results found. Preliminary read by me and Dr Tracie Harrier negative. Final read pending.  Procedures Procedures (including critical care time)  Medications Ordered in UC Medications  acetaminophen (TYLENOL) tablet 650 mg (650 mg Oral Given 08/08/23 1719)    Initial Impression / Assessment and Plan / UC Course  I have reviewed the triage vital signs and the nursing notes.  Pertinent labs  results that were available during my care of the patient were reviewed by me and considered in my medical decision making (see chart for details).  Flu like symptoms, I suspect influenza A but early and could be why the test has not turned positive STD screen  I placed him on Tamiflu and Tessalon as noted. We will inform him when the STD test is back.    Final Clinical Impressions(s) / UC Diagnoses   Final diagnoses:  Fever, unspecified  Acute cough  Flu-like symptoms     Discharge Instructions      The chest xay looks normal, but if the radiologist finds something different we will call you     ED Prescriptions     Medication Sig Dispense Auth. Provider   oseltamivir (TAMIFLU) 75 MG capsule Take 1 capsule (75 mg total) by mouth every 12 (twelve) hours. 10 capsule Rodriguez-Southworth, Jareth Pardee, PA-C   benzonatate (TESSALON)  200 MG capsule Take 1 capsule (  200 mg total) by mouth 3 (three) times daily as needed for cough. 30 capsule Rodriguez-Southworth, Nettie Elm, PA-C      PDMP not reviewed this encounter.   Garey Ham, [place] 08/08/23 1918

## 2023-08-08 NOTE — Discharge Instructions (Signed)
The chest xay looks normal, but if the radiologist finds something different we will call you

## 2023-08-09 LAB — CYTOLOGY, (ORAL, ANAL, URETHRAL) ANCILLARY ONLY
Chlamydia: NEGATIVE
Comment: NEGATIVE
Comment: NEGATIVE
Comment: NORMAL
Neisseria Gonorrhea: NEGATIVE
Trichomonas: NEGATIVE

## 2023-11-02 ENCOUNTER — Telehealth: Payer: Self-pay

## 2023-11-02 NOTE — Telephone Encounter (Signed)
 Pt. Has an appt. With you on 11/09/23 wanted to know if this can be done at that appt.   Copied from CRM [id number]. Topic: Clinical - Medical Advice >> Nov 02, 2023  8:50 AM Carlatta H wrote: Reason for CRM: Patient is needing a [employer] letter for mental health//Please advised patient if this can be done during is next visit//Please call patient to advise//

## 2023-11-09 ENCOUNTER — Encounter: Payer: Self-pay | Admitting: Medical

## 2023-11-09 ENCOUNTER — Ambulatory Visit: Admitting: Medical

## 2023-11-09 VITALS — BP 110/80 | HR 77 | Wt 219.6 lb

## 2023-11-09 DIAGNOSIS — J301 Allergic rhinitis due to pollen: Secondary | ICD-10-CM | POA: Insufficient documentation

## 2023-11-09 DIAGNOSIS — R7989 Other specified abnormal findings of blood chemistry: Secondary | ICD-10-CM | POA: Diagnosis not present

## 2023-11-09 DIAGNOSIS — Z13220 Encounter for screening for lipoid disorders: Secondary | ICD-10-CM

## 2023-11-09 DIAGNOSIS — Z113 Encounter for screening for infections with a predominantly sexual mode of transmission: Secondary | ICD-10-CM | POA: Diagnosis not present

## 2023-11-09 DIAGNOSIS — H9313 Tinnitus, bilateral: Secondary | ICD-10-CM | POA: Diagnosis not present

## 2023-11-09 DIAGNOSIS — L304 Erythema intertrigo: Secondary | ICD-10-CM | POA: Diagnosis not present

## 2023-11-09 DIAGNOSIS — Z0000 Encounter for general adult medical examination without abnormal findings: Secondary | ICD-10-CM

## 2023-11-09 DIAGNOSIS — F419 Anxiety disorder, unspecified: Secondary | ICD-10-CM

## 2023-11-09 DIAGNOSIS — R4586 Emotional lability: Secondary | ICD-10-CM

## 2023-11-09 LAB — LIPID PANEL

## 2023-11-09 LAB — POCT URINALYSIS DIP (PROADVANTAGE DEVICE)
Bilirubin, UA: NEGATIVE
Blood, UA: NEGATIVE
Glucose, UA: NEGATIVE mg/dL
Ketones, POC UA: NEGATIVE mg/dL
Leukocytes, UA: NEGATIVE
Nitrite, UA: NEGATIVE
Protein Ur, POC: NEGATIVE mg/dL
Specific Gravity, Urine: 1.01
Urobilinogen, Ur: 0.2
pH, UA: 6 (ref 5.0–8.0)

## 2023-11-09 MED ORDER — CETIRIZINE HCL 10 MG PO TABS: 10.0000 mg | ORAL_TABLET | Freq: Every day | ORAL | 2 refills | Status: DC

## 2023-11-09 MED ORDER — CETIRIZINE HCL 10 MG PO TABS: 10.0000 mg | ORAL_TABLET | Freq: Every day | ORAL | 0 refills | Status: AC

## 2023-11-09 MED ORDER — CLOTRIMAZOLE-BETAMETHASONE 1-0.05 % EX CREA: 1.0000 | TOPICAL_CREAM | Freq: Two times a day (BID) | CUTANEOUS | 0 refills | Status: DC

## 2023-11-09 MED ORDER — FLUTICASONE PROPIONATE 50 MCG/ACT NA SUSP: 2.0000 | Freq: Every day | NASAL | 2 refills | Status: AC

## 2023-11-09 NOTE — Progress Notes (Unsigned)
 Subjective:   HPI  Evan Turner. is a 28 y.o. male who presents for Chief Complaint  Patient presents with  . Annual Exam    Physical. Fasting.     Patient Care Team: Williard Keller, Newt Barefoot as PCP - General (Family Medicine) Dentist  Concerns: Having some allergy issues this past week, congestion, drainage . No fever, no body ache or chills, no sore throat, no cough.  Using nothing for symptoms.  Still having issues with anxiety issues, mood issues.  Had been seen here back in 2020 - 2022 for same.  Needs [employer] letter to submit to [place]. He wants to pursue treatment through [employer].   Anxiety is moderate of late.  Interferes with sleep.   Reflecting back, most of his anxiety is due to prior experiences in [employer].  Does get some mood swings.   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  Was on medicaiton and saw counseling back in 2020-2022 time frame.  Gets irritation in butt crack, wants cream for this.   Reviewed their medical, surgical, family, social, medication, and allergy history and updated chart as appropriate.  No Known Allergies  Past Medical History:  Diagnosis Date  . Anxiety 2020  . Mood change 2020    No current outpatient medications on file prior to visit.   No current facility-administered medications on file prior to visit.      Current Outpatient Medications:  .  clotrimazole -betamethasone (LOTRISONE) cream, Apply 1 Application topically 2 (two) times daily. No more than 1-2 weeks at a time, Disp: 30 g, Rfl: 0 .  fluticasone  (FLONASE ) 50 MCG/ACT nasal spray, Place 2 sprays into both nostrils daily., Disp: 16 g, Rfl: 2 .  cetirizine  (ZYRTEC ) 10 MG tablet, Take 1 tablet (10 mg total) by mouth at bedtime., Disp: 90 tablet, Rfl: 0  Family History  Problem Relation Age of Onset  . Hypertension Mother   . Diabetes Maternal Grandmother   . Cancer Neg Hx   . Heart disease Neg Hx   . Vision loss Neg Hx     Past Surgical  History:  Procedure Laterality Date  . NO PAST SURGERIES  11/2023     Review of Systems  Constitutional:  Negative for chills, fever, malaise/fatigue and weight loss.  HENT:  Negative for congestion, ear pain, hearing loss, sore throat and tinnitus.   Eyes:  Negative for blurred vision, pain and redness.  Respiratory:  Negative for cough, hemoptysis and shortness of breath.   Cardiovascular:  Negative for chest pain, palpitations, orthopnea, claudication and leg swelling.  Gastrointestinal:  Negative for abdominal pain, blood in stool, constipation, diarrhea, nausea and vomiting.  Genitourinary:  Negative for dysuria, flank pain, frequency, hematuria and urgency.  Musculoskeletal:  Negative for falls, joint pain and myalgias.  Skin:  Negative for itching and rash.  Neurological:  Negative for dizziness, tingling, speech change, weakness and headaches.  Endo/Heme/Allergies:  Negative for polydipsia. Does not bruise/bleed easily.  Psychiatric/Behavioral:  Negative for depression and memory loss. The patient is nervous/anxious and has insomnia.        Mood swings        Objective:  BP 110/80   Pulse 77   Wt 219 lb 9.6 oz (99.6 kg)   SpO2 98%   BMI 29.78 kg/m   General appearance: alert, no distress, WD/WN, African American male. Muscular build Skin: Midway down the gluteal cleft there is a little bit of pinkish coloration and irritation skin crease, otherwise no worrisome lesions  HEENT: normocephalic, conjunctiva/corneas normal, sclerae anicteric, PERRLA, EOMi, nares with turbinate swelling and clear discharge, no erythema, pharynx normal Oral cavity: MMM, tongue normal, teeth normal Neck: supple, no lymphadenopathy, no thyromegaly, no masses, normal ROM, no bruits Chest: non tender, normal shape and expansion Heart: RRR, normal S1, S2, no murmurs Lungs: CTA bilaterally, no wheezes, rhonchi, or rales Abdomen: +bs, soft, non tender, non distended, no masses, no hepatomegaly, no  splenomegaly, no bruits Back: non tender, normal ROM, no scoliosis Musculoskeletal: upper extremities non tender, no obvious deformity, normal ROM throughout, lower extremities non tender, no obvious deformity, normal ROM throughout Extremities: no edema, no cyanosis, no clubbing Pulses: 2+ symmetric, upper and lower extremities, normal cap refill Neurological: alert, oriented x 3, CN2-12 intact, strength normal upper extremities and lower extremities, sensation normal throughout, DTRs 2+ throughout, no cerebellar signs, gait normal Psychiatric: normal affect, behavior normal, pleasant  GU: normal male external genitalia,circumcised, nontender, no masses, no hernia, no lymphadenopathy Rectal: Deferred     11/09/2023    9:11 AM 11/09/2023    9:10 AM 03/31/2021   10:42 AM 11/26/2020   11:36 AM 06/26/2017    1:57 PM  Depression screen PHQ 2/9  Decreased Interest 1 0 0 0 0  Down, Depressed, Hopeless 1 0 0 0 0  PHQ - 2 Score 2 0 0 0 0  Altered sleeping 1      Tired, decreased energy 0      Change in appetite 0      Feeling bad or failure about yourself  0      Trouble concentrating 0      Moving slowly or fidgety/restless 0      Suicidal thoughts 0      PHQ-9 Score 3        Mood questionnaire abnormal screen    Assessment and Plan :   Encounter Diagnoses  Name Primary?  . Encounter for health maintenance examination in adult Yes  . Abnormal thyroid blood test   . Screening for lipid disorders   . Anxiety   . Mood changes   . Screen for STD (sexually transmitted disease)   . Intertrigo   . Allergic rhinitis due to pollen, unspecified seasonality   . Tinnitus of both ears     This visit was a preventative care visit, also known as wellness visit or routine physical.   Topics typically include healthy lifestyle, diet, exercise, preventative care, vaccinations, sick and well care, proper use of emergency dept and after hours care, as well as other concerns.     Separate  significant issues discussed: Anxiety, mood changes-we discussed his concerns.  I will work on writing him a Physicist, medical at his request to submit to the [place]  for mental health treatment.  I recommend he pursue counseling medication  Intertrigo-begin Lotrisone cream 1 to 2 weeks at a time.  Do not overuse this.  Keep the area clean and dry  Allergic rhinitis-begin fluticasone  nasal spray and can also use optional oral antihistamine daily for the next 1 to 2 months through allergy season  History of ringing in the ears -hearing test today performed    General Recommendations: Continue to return yearly for your annual wellness and preventative care visits.  This gives us  a chance to discuss healthy lifestyle, exercise, vaccinations, review your chart record, and perform screenings where appropriate.  I recommend you see your eye doctor yearly for routine vision care.  I recommend you see your dentist yearly for routine dental  care including hygiene visits twice yearly.   Vaccination  Immunization History  Administered Date(s) Administered  . Adenovirus 12/17/2014  . DTaP 10/16/1995, 02/06/1996, 04/08/1996, 10/24/1996, 01/27/2000  . Hepatitis B 10/16/1995, 02/06/1996, 04/08/1996  . Hepatitis B, PED/ADOLESCENT 12/17/2014  . IPV 10/16/1995, 02/06/1996, 10/24/1996, 11/11/1999, 12/15/2014, 04/08/2016  . MMR 09/05/1996, 10/24/1996  . Meningococcal Conjugate 12/15/2014  . Tdap 02/27/2008, 12/15/2014, 07/19/2017  . Varicella 10/24/1996, 02/27/2008, 12/17/2014    Vaccine recommendations: Yearly flu shot   Screening for cancer: Colon cancer screening: Age 66  Testicular cancer screening You should do a monthly self testicular exam if you are between 45-44 years old, and we typically do a testicular exam on the yearly physical for this same age group.   Prostate Cancer screening: The recommended prostate cancer screening test is a blood test called the prostate-specific antigen (PSA) test.  PSA is a protein that is made in the prostate. As you age, your prostate naturally produces more PSA. Abnormally high PSA levels may be caused by: Prostate cancer. An enlarged prostate that is not caused by cancer (benign prostatic hyperplasia, or BPH). This condition is very common in older men. A prostate gland infection (prostatitis) or urinary tract infection. Certain medicines such as male hormones (like testosterone) or other medicines that raise testosterone levels. A rectal exam may be done as part of prostate cancer screening to help provide information about the size of your prostate gland. When a rectal exam is performed, it should be done after the PSA level is drawn to avoid any effect on the results.   Skin cancer screening: Check your skin regularly for new changes, growing lesions, or other lesions of concern Come in for evaluation if you have skin lesions of concern.   Lung cancer screening: If you have a greater than 20 pack year history of tobacco use, then you may qualify for lung cancer screening with a chest CT scan.   Please call your insurance company to inquire about coverage for this test.   Pancreatic cancer:  no current screening test is available or routinely recommended. (risk factors: smoking, overweight or obese, diabetes, chronic pancreatitis, work exposure - dry cleaning, metal working, 28yo>, M>F, Tree surgeon, family hx/o, hereditary breast, ovarian, melanoma, lynch, peutz-jeghers).  Symptoms: jaundice, dark urine, light color or greasy stools, itchy skin, belly or back pain, weight loss, poor appetite, nausea, vomiting, liver enlargement, DVT/blood clots.   We currently don't have screenings for other cancers besides breast, cervical, colon, and lung cancers.  If you have a strong family history of cancer or have other cancer screening concerns, please let me know.  Genetic testing referral is an option for individuals with high cancer risk in the family.   There are some other cancer screenings in development currently.   Bone health: Get at least 150 minutes of aerobic exercise weekly Get weight bearing exercise at least once weekly Bone density test:  A bone density test is an imaging test that uses a type of X-ray to measure the amount of calcium and other minerals in your bones. The test may be used to diagnose or screen you for a condition that causes weak or thin bones (osteoporosis), predict your risk for a broken bone (fracture), or determine how well your osteoporosis treatment is working. The bone density test is recommended for females 65 and older, or females or males <65 if certain risk factors such as thyroid disease, long term use of steroids such as for asthma or rheumatological issues, vitamin  D deficiency, estrogen deficiency, family history of osteoporosis, self or family history of fragility fracture in first degree relative.    Heart health: Get at least 150 minutes of aerobic exercise weekly Limit alcohol It is important to maintain a healthy blood pressure and healthy cholesterol numbers  Heart disease screening: Screening for heart disease includes screening for blood pressure, fasting lipids, glucose/diabetes screening, BMI height to weight ratio, reviewed of smoking status, physical activity, and diet.    Goals include blood pressure 120/80 or less, maintaining a healthy lipid/cholesterol profile, preventing diabetes or keeping diabetes numbers under good control, not smoking or using tobacco products, exercising most days per week or at least 150 minutes per week of exercise, and eating healthy variety of fruits and vegetables, healthy oils, and avoiding unhealthy food choices like fried food, fast food, high sugar and high cholesterol foods.    Other tests may possibly include EKG test, CT coronary calcium score, echocardiogram, exercise treadmill stress test.      Vascular disease screening: For higher risk  individuals including smokers, diabetics, patients with known heart disease or high blood pressure, kidney disease, and others, screening for vascular disease or atherosclerosis of the arteries is available.  Examples may include carotid ultrasound, abdominal aortic ultrasound, ABI blood flow screening in the legs, thoracic aorta screening.   Medical care options: I recommend you continue to seek care here first for routine care.  We try really hard to have available appointments Monday through Friday daytime hours for sick visits, acute visits, and physicals.  Urgent care should be used for after hours and weekends for significant issues that cannot wait till the next day.  The emergency department should be used for significant potentially life-threatening emergencies.  The emergency department is expensive, can often have long wait times for less significant concerns, so try to utilize primary care, urgent care, or telemedicine when possible to avoid unnecessary trips to the emergency department.  Virtual visits and telemedicine have been introduced since the pandemic started in 2020, and can be convenient ways to receive medical care.  We offer virtual appointments as well to assist you in a variety of options to seek medical care.   Legal  Take the time to do a last will and testament, Advanced Directives including Health Care Power of Attorney and Living Will documents.  Don't leave your family with burdens that can be handled ahead of time.   Advanced Directives: I recommend you consider completing a Health Care Power of Attorney and Living Will.   These documents respect your wishes and help alleviate burdens on your loved ones if you were to become terminally ill or be in a position to need those documents enforced.    You can complete Advanced Directives yourself, have them notarized, then have copies made for our office, for you and for anybody you feel should have them in safe  keeping.  Or, you can have an attorney prepare these documents.   If you haven't updated your Last Will and Testament in a while, it may be worthwhile having an attorney prepare these documents together and save on some costs.       Spiritual and Emotional Health Keeping a healthy spiritual life can help you better manage your physical health. Your spiritual life can help you to cope with any issues that may arise with your physical health.  Balance can keep us  healthy and help us  to recover.  If you are struggling with your spiritual health there  are questions that you may want to ask yourself:  What makes me feel most complete? When do I feel most connected to the rest of the world? Where do I find the most inner strength? What am I doing when I feel whole?  Helpful tips: Being in nature. Some people feel very connected and at peace when they are walking outdoors or are outside. Helping others. Some feel the largest sense of wellbeing when they are of service to others. Being of service can take on many forms. It can be doing volunteer work, being kind to strangers, or offering a hand to a friend in need. Gratitude. Some people find they feel the most connected when they remain grateful. They may make lists of all the things they are grateful for or say a thank you out loud for all they have.    Emotional Health Are you in tune with your emotional health?  Check out this link: http://www.marquez-love.com/    Financial Health Make sure you use a budget for your personal finances Make sure you are insured against risks (health insurance, life insurance, auto insurance, etc) Save more, spend less Set financial goals If you need help in this area, good resources include counseling through [employer] or other community resources, have a meeting with a Social research officer, government, and a good resource is the [employer]    Hiren was seen today for annual  exam.  Diagnoses and all orders for this visit:  Encounter for health maintenance examination in adult -     Comprehensive metabolic panel with GFR -     CBC -     Lipid panel -     TSH + free T4 -     VITAMIN D 25 Hydroxy (Vit-D Deficiency, Fractures) -     Cancel: Urinalysis, Routine w reflex microscopic -     Hepatitis C antibody -     HIV Antibody (routine testing w rflx) -     RPR -     Hepatitis B surface antigen -     POCT Urinalysis DIP (Proadvantage Device)  Abnormal thyroid blood test -     TSH + free T4  Screening for lipid disorders -     Lipid panel  Anxiety -     TSH + free T4 -     VITAMIN D 25 Hydroxy (Vit-D Deficiency, Fractures)  Mood changes -     TSH + free T4 -     VITAMIN D 25 Hydroxy (Vit-D Deficiency, Fractures)  Screen for STD (sexually transmitted disease) -     Hepatitis C antibody -     HIV Antibody (routine testing w rflx) -     RPR -     Hepatitis B surface antigen  Intertrigo  Allergic rhinitis due to pollen, unspecified seasonality  Tinnitus of both ears  Other orders -     fluticasone  (FLONASE ) 50 MCG/ACT nasal spray; Place 2 sprays into both nostrils daily. -     Discontinue: cetirizine  (ZYRTEC ) 10 MG tablet; Take 1 tablet (10 mg total) by mouth at bedtime. -     clotrimazole -betamethasone (LOTRISONE) cream; Apply 1 Application topically 2 (two) times daily. No more than 1-2 weeks at a time -     cetirizine  (ZYRTEC ) 10 MG tablet; Take 1 tablet (10 mg total) by mouth at bedtime.     Follow-up pending labs, yearly for physical

## 2023-11-10 LAB — CBC
Hematocrit: 44.3 % (ref 37.5–51.0)
Hemoglobin: 14.9 g/dL (ref 13.0–17.7)
MCH: 27.9 pg (ref 26.6–33.0)
MCHC: 33.6 g/dL (ref 31.5–35.7)
MCV: 83 fL (ref 79–97)
Platelets: 195 10*3/uL (ref 150–450)
RBC: 5.35 x10E6/uL (ref 4.14–5.80)
RDW: 13.1 % (ref 11.6–15.4)
WBC: 3 10*3/uL — ABNORMAL LOW (ref 3.4–10.8)

## 2023-11-10 LAB — HEPATITIS C ANTIBODY

## 2023-11-10 LAB — COMPREHENSIVE METABOLIC PANEL WITH GFR
ALT: 21 IU/L (ref 0–44)
AST: 23 IU/L (ref 0–40)
Albumin: 4.8 g/dL (ref 4.3–5.2)
Alkaline Phosphatase: 58 IU/L (ref 44–121)
BUN/Creatinine Ratio: 12 (ref 9–20)
BUN: 15 mg/dL (ref 6–20)
Bilirubin Total: 0.3 mg/dL (ref 0.0–1.2)
CO2: 23 mmol/L (ref 20–29)
Calcium: 9.7 mg/dL (ref 8.7–10.2)
Chloride: 101 mmol/L (ref 96–106)
Creatinine, Ser: 1.25 mg/dL (ref 0.76–1.27)
Globulin, Total: 2.7 g/dL (ref 1.5–4.5)
Glucose: 92 mg/dL (ref 70–99)
Potassium: 4.1 mmol/L (ref 3.5–5.2)
Sodium: 137 mmol/L (ref 134–144)
Total Protein: 7.5 g/dL (ref 6.0–8.5)
eGFR: 80 mL/min/{1.73_m2} (ref >59)

## 2023-11-10 LAB — LIPID PANEL
Cholesterol, Total: 210 mg/dL — ABNORMAL HIGH (ref 100–199)
HDL: 66 mg/dL (ref >39)
LDL CALC COMMENT:: 3.2 ratio (ref 0.0–5.0)
LDL Chol Calc (NIH): 129 mg/dL — ABNORMAL HIGH (ref 0–99)
Triglycerides: 87 mg/dL (ref 0–149)
VLDL Cholesterol Cal: 15 mg/dL (ref 5–40)

## 2023-11-10 LAB — TSH+FREE T4
Free T4: 1.17 ng/dL (ref 0.82–1.77)
TSH: 5.08 u[IU]/mL — ABNORMAL HIGH (ref 0.450–4.500)

## 2023-11-10 LAB — HIV ANTIBODY (ROUTINE TESTING W REFLEX)

## 2023-11-10 LAB — HEPATITIS B SURFACE ANTIGEN

## 2023-11-10 LAB — RPR

## 2023-11-10 LAB — VITAMIN D 25 HYDROXY (VIT D DEFICIENCY, FRACTURES): Vit D, 25-Hydroxy: 26.1 ng/mL — ABNORMAL LOW (ref 30.0–100.0)

## 2023-11-12 ENCOUNTER — Other Ambulatory Visit: Payer: Self-pay | Admitting: Medical

## 2023-11-12 MED ORDER — VITAMIN D (ERGOCALCIFEROL) 1.25 MG (50000 UNIT) PO CAPS: 50000.0000 [IU] | ORAL_CAPSULE | ORAL | 3 refills | Status: DC

## 2023-11-12 NOTE — Progress Notes (Signed)
 Results sent through MyChart

## 2023-11-13 ENCOUNTER — Encounter: Payer: Self-pay | Admitting: Medical

## 2023-11-14 NOTE — Progress Notes (Signed)
 Pt will come pick up letter. I have also emailed this to him

## 2023-12-25 ENCOUNTER — Telehealth: Payer: Self-pay | Admitting: *Deleted

## 2023-12-25 DIAGNOSIS — R4586 Emotional lability: Secondary | ICD-10-CM

## 2023-12-25 DIAGNOSIS — F419 Anxiety disorder, unspecified: Secondary | ICD-10-CM

## 2023-12-25 NOTE — Telephone Encounter (Signed)
 Copied from CRM [id number]. Topic: Referral - Question >> Dec 25, 2023  3:26 PM Lotus Round B wrote: Reason for CRM: pt would like to see if he can get a referral to see a physiatric if possible and give a call as soon as possible  Forwarding to [place] for when he returns. Patient was advised that he is out of the office.

## 2023-12-31 ENCOUNTER — Other Ambulatory Visit: Payer: Self-pay
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
## 2024-01-23 ENCOUNTER — Ambulatory Visit (HOSPITAL_COMMUNITY): Admitting: Psychiatry

## 2024-01-23 ENCOUNTER — Encounter (HOSPITAL_COMMUNITY): Payer: Self-pay | Admitting: Psychiatry

## 2024-01-23 DIAGNOSIS — H9313 Tinnitus, bilateral: Secondary | ICD-10-CM

## 2024-01-23 MED ORDER — MIRTAZAPINE 15 MG PO TABS: 15.0000 mg | ORAL_TABLET | Freq: Every day | ORAL | 1 refills | Status: DC

## 2024-01-23 MED ORDER — HYDROXYZINE HCL 25 MG PO TABS: 25.0000 mg | ORAL_TABLET | Freq: Two times a day (BID) | ORAL | 1 refills | Status: DC | PRN

## 2024-01-23 NOTE — Patient Instructions (Signed)
 Thank you for attending your appointment today.  -- START Remeron  15 mg nightly -- START hydroxyzine  12.5-25 mg twice daily as needed for acute anxiety/sleep -- Continue other medications as prescribed.  Please do not make any changes to medications without first discussing with your provider. If you are experiencing a psychiatric emergency, please call 911 or present to your nearest emergency department. Additional crisis, medication management, and therapy resources are included below.  [facility]  [address] [place], [place] [address] [phone] WALK-IN URGENT CARE 24/7 FOR ANYONE [address] [place], [place]  [phone] Fax: [phone] [email or web address] *Interpreters available *Accepts all insurance and uninsured for Urgent Care needs *Accepts Medicaid and uninsured for outpatient treatment (below)      ONLY FOR [facility]  Below:    Outpatient New Patient Assessment/Therapy Walk-ins:        Monday, Wednesday, and Thursday 8am until slots are full (first come, first served)                   New Patient Psychiatry/Medication Management        Monday-Friday 8am-11am (first come, first served)               For all walk-ins we ask that you arrive by 7:15am, because patients will be seen in the order of arrival.

## 2024-01-24 ENCOUNTER — Encounter (INDEPENDENT_AMBULATORY_CARE_PROVIDER_SITE_OTHER): Payer: Self-pay

## 2024-01-25 ENCOUNTER — Encounter (HOSPITAL_COMMUNITY): Payer: Self-pay

## 2024-02-29 ENCOUNTER — Encounter (INDEPENDENT_AMBULATORY_CARE_PROVIDER_SITE_OTHER): Payer: Self-pay

## 2024-03-07 ENCOUNTER — Ambulatory Visit (HOSPITAL_COMMUNITY): Admitting: Mental Health
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
## 2024-03-13 ENCOUNTER — Encounter (HOSPITAL_COMMUNITY): Payer: Self-pay | Admitting: Psychiatry

## 2024-03-13 ENCOUNTER — Encounter (HOSPITAL_COMMUNITY): Payer: Self-pay

## 2024-03-13 ENCOUNTER — Telehealth (HOSPITAL_COMMUNITY): Admitting: Psychiatry

## 2024-03-13 DIAGNOSIS — F0630 Mood disorder due to known physiological condition, unspecified: Secondary | ICD-10-CM | POA: Diagnosis not present

## 2024-03-13 DIAGNOSIS — F4310 Post-traumatic stress disorder, unspecified: Secondary | ICD-10-CM

## 2024-03-13 DIAGNOSIS — H9313 Tinnitus, bilateral: Secondary | ICD-10-CM | POA: Diagnosis not present

## 2024-03-13 MED ORDER — HYDROXYZINE HCL 25 MG PO TABS: 25.0000 mg | ORAL_TABLET | Freq: Two times a day (BID) | ORAL | 1 refills | Status: DC | PRN

## 2024-03-13 MED ORDER — MIRTAZAPINE 15 MG PO TABS: 15.0000 mg | ORAL_TABLET | Freq: Every day | ORAL | 1 refills | Status: AC

## 2024-03-13 MED ORDER — HYDROXYZINE HCL 25 MG PO TABS: 12.5000 mg | ORAL_TABLET | Freq: Two times a day (BID) | ORAL | 1 refills | Status: AC | PRN

## 2024-03-13 NOTE — Patient Instructions (Signed)
 Thank you for attending your appointment today.  -- Start taking Remeron  every night -- Start Atarax  1/2 to 1 tablet up to twice daily for acute anxiety and overwhelm. Can also be taken for sleep. -- Continue other medications as prescribed.  Please do not make any changes to medications without first discussing with your provider. If you are experiencing a psychiatric emergency, please call 911 or present to your nearest emergency department. Additional crisis, medication management, and therapy resources are included below.  [facility]  [address], [place], [place] [address] [phone] WALK-IN URGENT CARE 24/7 FOR ANYONE [address], [place], [place]  [phone] Fax: [phone] [email or web address] *Interpreters available *Accepts all insurance and uninsured for Urgent Care needs *Accepts Medicaid and uninsured for outpatient treatment (below)      ONLY FOR [facility]  Below:    Outpatient New Patient Assessment/Therapy Walk-ins:        Monday, Wednesday, and Thursday 8am until slots are full (first come, first served)                   New Patient Psychiatry/Medication Management        Monday-Friday 8am-11am (first come, first served)               For all walk-ins we ask that you arrive by 7:15am, because patients will be seen in the order of arrival.

## 2024-04-16 ENCOUNTER — Ambulatory Visit (INDEPENDENT_AMBULATORY_CARE_PROVIDER_SITE_OTHER): Payer: Self-pay | Admitting: Medical

## 2024-04-16 VITALS — BP 122/80 | HR 81 | Ht 73.0 in | Wt 228.8 lb

## 2024-04-16 DIAGNOSIS — Z113 Encounter for screening for infections with a predominantly sexual mode of transmission: Secondary | ICD-10-CM

## 2024-04-16 NOTE — Progress Notes (Signed)
  Subjective:  Evan Turner. is a 28 y.o. male who presents for concern for STD screening.  Likes to get tested twice yearly.   No new paritners.  Uses condoms most of the time.       No other aggravating or relieving factors.    No other c/o.  The following portions of the patient's history were reviewed and updated as appropriate: allergies, current medications, past family history, past medical history, past social history, past surgical history and problem list.  ROS Otherwise as in subjective above  Objective:Exam BP 122/80   Pulse 81   Ht 6' 1 (1.854 m)   Wt 228 lb 12.8 oz (103.8 kg)   SpO2 100%   BMI 30.19 kg/m   General appearance: alert, no distress, WD/WN Declined exam otherwise    Assessment: Encounter Diagnosis  Name Primary?   Screen for STD (sexually transmitted disease) Yes     Plan: Discussed testing, screening, prevention.    Follow up: pending labs  Evan Turner was seen today for acute visit.  Diagnoses and all orders for this visit:  Screen for STD (sexually transmitted disease) -     RPR+HIV+GC+CT Panel

## 2024-04-17 ENCOUNTER — Ambulatory Visit: Payer: Self-pay | Admitting: Medical

## 2024-04-17 ENCOUNTER — Other Ambulatory Visit: Payer: Self-pay | Admitting: Medical

## 2024-04-20 LAB — RPR+HIV+GC+CT PANEL
Chlamydia trachomatis, NAA: NEGATIVE
HIV Screen 4th Generation wRfx: NONREACTIVE
Neisseria Gonorrhoeae by PCR: NEGATIVE
RPR Ser Ql: NONREACTIVE

## 2024-04-20 NOTE — Progress Notes (Signed)
 Results thru my chart

## 2024-04-28 ENCOUNTER — Ambulatory Visit (HOSPITAL_COMMUNITY): Admitting: Mental Health

## 2024-05-06 NOTE — Progress Notes (Unsigned)
 Patient did not connect for virtual psychiatric medication management appointment on 05/07/24 at 1:30PM. Sent secure video link with no response. Left VM with callback number to reschedule.  LAURAINE DELENA PUMMEL, MD 05/07/24

## 2024-05-07 ENCOUNTER — Encounter (HOSPITAL_COMMUNITY): Payer: Self-pay

## 2024-05-07 ENCOUNTER — Encounter (HOSPITAL_COMMUNITY): Admitting: Psychiatry

## 2024-08-08 ENCOUNTER — Encounter (HOSPITAL_COMMUNITY): Payer: Self-pay

## 2024-08-08 ENCOUNTER — Ambulatory Visit (HOSPITAL_COMMUNITY)
Admission: EM | Admit: 2024-08-08 | Discharge: 2024-08-08 | Disposition: A | Discharge status: Home / Self Care | Attending: Emergency Medicine | Admitting: Emergency Medicine

## 2024-08-08 DIAGNOSIS — J329 Chronic sinusitis, unspecified: Secondary | ICD-10-CM

## 2024-08-08 DIAGNOSIS — J40 Bronchitis, not specified as acute or chronic: Secondary | ICD-10-CM

## 2024-08-08 DIAGNOSIS — B9789 Other viral agents as the cause of diseases classified elsewhere: Secondary | ICD-10-CM

## 2024-08-08 DIAGNOSIS — J988 Other specified respiratory disorders: Secondary | ICD-10-CM

## 2024-08-08 MED ORDER — PREDNISONE 20 MG PO TABS: 40.0000 mg | ORAL_TABLET | Freq: Every day | ORAL | 0 refills | Status: AC

## 2024-08-08 MED ORDER — BENZONATATE 100 MG PO CAPS: 100.0000 mg | ORAL_CAPSULE | Freq: Three times a day (TID) | ORAL | 0 refills | Status: AC

## 2024-08-08 MED ORDER — AZELASTINE HCL 0.1 % NA SOLN: 2.0000 | Freq: Two times a day (BID) | NASAL | 0 refills | Status: AC

## 2024-08-08 NOTE — ED Provider Notes (Signed)
 " [facility]    CSN: [id number] Arrival date & time: 08/08/24  1654      History   Chief Complaint Chief Complaint  Patient presents with   Cough   Nasal Congestion    HPI Evan Turner. is a 29 y.o. male.   Patient presents with productive cough, nasal congestion, and slight chest congestion that began approximately 2 weeks ago.  Patient reports that he began to have a sore throat about 2 days ago.  Denies any fever, chest pain, shortness of breath, nausea, vomiting, and diarrhea.  Patient denies taking any medication for his symptoms.  Patient denies any known sick exposures.  Patient denies any history of asthma or COPD.  The history is provided by the patient and medical records.  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 Authorizing Provider  azelastine  (ASTELIN ) 0.1 % nasal spray Place 2 sprays into both nostrils 2 (two) times daily. Use in each nostril as directed 08/08/24  Yes Johnie Flaming A, NP  benzonatate  (TESSALON ) 100 MG capsule Take 1 capsule (100 mg total) by mouth every 8 (eight) hours. 08/08/24  Yes Johnie, Alyanna Stoermer A, NP  predniSONE  (DELTASONE ) 20 MG tablet  Take 2 tablets (40 mg total) by mouth daily for 5 days. 08/08/24 08/13/24 Yes Shenandoah Yeats A, NP  cetirizine  (ZYRTEC ) 10 MG tablet Take 1 tablet (10 mg total) by mouth at bedtime. 11/09/23   Tysinger, Alm RAMAN, PA-C  fluticasone  (FLONASE ) 50 MCG/ACT nasal spray Place 2 sprays into both nostrils daily. Patient not taking: Reported on 08/08/2024 11/09/23   Tysinger, Alm RAMAN, PA-C  mirtazapine  (REMERON ) 15 MG tablet Take 1 tablet (15 mg total) by mouth at bedtime. Patient not taking: Reported on 08/08/2024 03/13/24 05/12/24  Mercy Lauraine LABOR    Family History Family History  Problem Relation Age of Onset   Hypertension Mother    Diabetes Maternal Grandmother    Cancer Neg Hx    Heart disease Neg Hx    Vision loss Neg Hx     Social History Social History[1]   Allergies   Patient has no known allergies.   Review of Systems Review of Systems  Respiratory:  Positive for cough.    Per HPI  Physical Exam Triage Vital Signs ED Triage Vitals [08/08/24 1828]  Encounter Vitals Group     BP 120/74     Girls Systolic BP Percentile      Girls Diastolic BP Percentile      Boys Systolic BP Percentile      Boys Diastolic BP Percentile      Pulse Rate 87     Resp 16  Temp 98.1 F (36.7 C)     Temp Source Oral     SpO2 96 %     Weight      Height      Head Circumference      Peak Flow      Pain Score 5     Pain Loc      Pain Education      Exclude from Growth Chart    No data found.  Updated Vital Signs BP 120/74 (BP Location: Right Arm)   Pulse 87   Temp 98.1 F (36.7 C) (Oral)   Resp 16   SpO2 96%   Visual Acuity Right Eye Distance:   Left Eye Distance:   Bilateral Distance:    Right Eye Near:   Left Eye Near:    Bilateral Near:     Physical Exam Vitals and nursing note reviewed.  Constitutional:      General: He is awake. He is not in acute distress.    Appearance: Normal appearance. He is well-developed and well-groomed. He is not ill-appearing.  HENT:      Right Ear: Tympanic membrane, ear canal and external ear normal.     Left Ear: Tympanic membrane, ear canal and external ear normal.     Nose: Congestion and rhinorrhea present.     Mouth/Throat:     Mouth: Mucous membranes are moist.     Pharynx: Posterior oropharyngeal erythema and postnasal drip present. No oropharyngeal exudate.     Tonsils: No tonsillar exudate.  Cardiovascular:     Rate and Rhythm: Normal rate and regular rhythm.  Pulmonary:     Effort: Pulmonary effort is normal.     Breath sounds: Normal breath sounds.  Skin:    General: Skin is warm and dry.  Neurological:     General: No focal deficit present.     Mental Status: He is alert and oriented to person, place, and time. Mental status is at baseline.  Psychiatric:        Behavior: Behavior is cooperative.      UC Treatments / Results  Labs (all labs ordered are listed, but only abnormal results are displayed) Labs Reviewed - No data to display  EKG   Radiology No results found.  Procedures Procedures (including critical care time)  Medications Ordered in UC Medications - No data to display  Initial Impression / Assessment and Plan / UC Course  I have reviewed the triage vital signs and the nursing notes.  Pertinent labs & imaging results that were available during my care of the patient were reviewed by me and considered in my medical decision making (see chart for details).     Patient is overall well-appearing.  Vitals are stable.  Deferred viral testing due to duration of symptoms.  Symptoms likely viral in nature.  Prescribed prednisone  to help with inflammation related to symptoms.  Prescribed azelastine  nasal spray for nasal congestion.  Prescribed Tessalon  as needed for cough.  Discussed over-the-counter medications for symptoms.  Discussed follow-up and return precautions. Final Clinical Impressions(s) / UC Diagnoses   Final diagnoses:  Viral respiratory illness  Sinobronchitis      Discharge Instructions      Start taking 2 tablets of prednisone  once daily for 5 days to help with inflammation related to your symptoms. You can use azelastine  nasal spray twice daily for additional relief. Take Tessalon  every 8 hours as needed for cough. You can also take over-the-counter Mucinex for cough and congestion  as well. Make sure you are staying hydrated and getting lots of rest. Follow-up with your primary care provider or return here as needed.   ED Prescriptions     Medication Sig Dispense Auth. Provider   predniSONE  (DELTASONE ) 20 MG tablet Take 2 tablets (40 mg total) by mouth daily for 5 days. 10 tablet Johnie Flaming A, NP   azelastine  (ASTELIN ) 0.1 % nasal spray Place 2 sprays into both nostrils 2 (two) times daily. Use in each nostril as directed 30 mL Johnie Flaming A, NP   benzonatate  (TESSALON ) 100 MG capsule Take 1 capsule (100 mg total) by mouth every 8 (eight) hours. 21 capsule Johnie Flaming A, NP      PDMP not reviewed this encounter.    [1]  Social History Tobacco Use   Smoking status: Never   Smokeless tobacco: Never  Vaping Use   Vaping status: Never Used  Substance Use Topics   Alcohol use: Yes    Alcohol/week: 2.0 standard drinks of alcohol    Types: 2 Cans of beer per week    Comment: 2 drinks on the weekend   Drug use: No     Johnie Flaming LABOR, NP 08/08/24 1917  "

## 2024-08-08 NOTE — ED Triage Notes (Signed)
 Patient reports that he has had nasal congestion, a productive cough with clear to light yellow sputum x 2 weeks. Patient states he has had a slight sore throat x 2 days.   Patient denies taking any medication for his symptoms.

## 2024-08-08 NOTE — Discharge Instructions (Signed)
 Start taking 2 tablets of prednisone  once daily for 5 days to help with inflammation related to your symptoms. You can use azelastine  nasal spray twice daily for additional relief. Take Tessalon  every 8 hours as needed for cough. You can also take over-the-counter Mucinex for cough and congestion as well. Make sure you are staying hydrated and getting lots of rest. Follow-up with your primary care provider or return here as needed.

## 2024-08-20 ENCOUNTER — Ambulatory Visit (HOSPITAL_COMMUNITY): Admitting: Psychiatry

## 2024-11-17 ENCOUNTER — Encounter: Payer: Self-pay | Admitting: Medical
